# Patient Record
Sex: Male | Born: 1980 | Race: White | Hispanic: No | Marital: Single | Smoking: Current every day smoker
Health system: Southern US, Community
[De-identification: ages and names within clinical notes are randomized; demographics above are authoritative.]

## PROBLEM LIST (undated history)

## (undated) DIAGNOSIS — F32A Depression, unspecified: Secondary | ICD-10-CM

## (undated) DIAGNOSIS — F329 Major depressive disorder, single episode, unspecified: Secondary | ICD-10-CM

## (undated) DIAGNOSIS — F419 Anxiety disorder, unspecified: Secondary | ICD-10-CM

## (undated) HISTORY — PX: ORIF ANKLE FRACTURE: SUR919

## (undated) HISTORY — PX: WISDOM TOOTH EXTRACTION: SHX21

---

## 2003-01-23 ENCOUNTER — Other Ambulatory Visit (HOSPITAL_COMMUNITY): Admission: RE | Admit: 2003-01-23 | Discharge: 2003-01-23 | Payer: Self-pay | Admitting: Psychiatry

## 2011-11-10 ENCOUNTER — Encounter (HOSPITAL_COMMUNITY): Payer: Self-pay | Admitting: Emergency Medicine

## 2011-11-10 ENCOUNTER — Emergency Department (HOSPITAL_COMMUNITY)
Admission: EM | Admit: 2011-11-10 | Discharge: 2011-11-14 | Disposition: A | Discharge status: Home / Self Care | Payer: Self-pay | Attending: Emergency Medicine | Admitting: Emergency Medicine

## 2011-11-10 DIAGNOSIS — T50904A Poisoning by unspecified drugs, medicaments and biological substances, undetermined, initial encounter: Secondary | ICD-10-CM | POA: Insufficient documentation

## 2011-11-10 DIAGNOSIS — F3289 Other specified depressive episodes: Secondary | ICD-10-CM | POA: Insufficient documentation

## 2011-11-10 DIAGNOSIS — F329 Major depressive disorder, single episode, unspecified: Secondary | ICD-10-CM

## 2011-11-10 DIAGNOSIS — R45851 Suicidal ideations: Secondary | ICD-10-CM

## 2011-11-10 DIAGNOSIS — F32A Depression, unspecified: Secondary | ICD-10-CM

## 2011-11-10 DIAGNOSIS — T50901A Poisoning by unspecified drugs, medicaments and biological substances, accidental (unintentional), initial encounter: Secondary | ICD-10-CM | POA: Insufficient documentation

## 2011-11-10 DIAGNOSIS — F17200 Nicotine dependence, unspecified, uncomplicated: Secondary | ICD-10-CM | POA: Insufficient documentation

## 2011-11-10 DIAGNOSIS — F411 Generalized anxiety disorder: Secondary | ICD-10-CM | POA: Insufficient documentation

## 2011-11-10 HISTORY — DX: Major depressive disorder, single episode, unspecified: F32.9

## 2011-11-10 HISTORY — DX: Anxiety disorder, unspecified: F41.9

## 2011-11-10 HISTORY — DX: Depression, unspecified: F32.A

## 2011-11-10 LAB — CBC
MCH: 31.3 pg (ref 26.0–34.0)
Platelets: 185 10*3/uL (ref 150–400)
RBC: 5.01 MIL/uL (ref 4.22–5.81)
RDW: 11.9 % (ref 11.5–15.5)
WBC: 8.3 10*3/uL (ref 4.0–10.5)

## 2011-11-10 LAB — COMPREHENSIVE METABOLIC PANEL
ALT: 22 U/L (ref 0–53)
AST: 20 U/L (ref 0–37)
Albumin: 4.9 g/dL (ref 3.5–5.2)
CO2: 26 mEq/L (ref 19–32)
Calcium: 10.5 mg/dL (ref 8.4–10.5)
GFR calc non Af Amer: >90 mL/min (ref >90)
Sodium: 145 mEq/L (ref 135–145)

## 2011-11-10 MED ORDER — LORAZEPAM 2 MG/ML IJ SOLN
2.0000 mg | Freq: Once | INTRAMUSCULAR | Status: AC
  Administered 2011-11-10: 2 mg via INTRAMUSCULAR
  Filled 2011-11-10: qty 1

## 2011-11-10 MED ORDER — LORAZEPAM 2 MG/ML IJ SOLN
2.0000 mg | INTRAMUSCULAR | Status: DC | PRN
  Administered 2011-11-10 – 2011-11-11 (×2): 2 mg via INTRAMUSCULAR
  Filled 2011-11-10 (×2): qty 1

## 2011-11-10 NOTE — ED Notes (Signed)
Patient is currently speaking with pharmacy tech with no complications

## 2011-11-10 NOTE — ED Notes (Signed)
Family at bedside. 

## 2011-11-10 NOTE — ED Notes (Addendum)
Pt here with family sts pt took approx 14 of dads BP meds last night; pt denies intent to harm self but is avoiding eye contact and not easy to answer questions; pt sts unknown reason for taking meds; pt yawning at present with dilated pupils; pt attempting to leave out of door; per family pt defecating on floor and acting strange at home

## 2011-11-10 NOTE — ED Notes (Signed)
Please see pt's belonging log in binder for full detail of pt's clothes

## 2011-11-10 NOTE — ED Notes (Signed)
Visitor in room with pt; visitor was wanded and escorted to room by security; sitter at bedside

## 2011-11-10 NOTE — ED Notes (Signed)
Sitter returned.  

## 2011-11-10 NOTE — ED Notes (Signed)
Pt admits to taking his fathers bp medications, by shaking his head.  He will not verbally answer any questions for me at this time.

## 2011-11-10 NOTE — ED Provider Notes (Signed)
Medical screening examination/treatment/procedure(s) were conducted as a shared visit with non-physician practitioner(s) and myself.  I personally evaluated the patient during the encounter  Pt with ? Overdose but unknown which meds. Possibly Toprol per father but cannot find meds. Pt uncooperative with history, wandering the ED. ?new psychosis vs substance abuse. Normal vitals.   Charles B. Bernette Mayers, MD 11/10/11 1718

## 2011-11-10 NOTE — ED Notes (Signed)
Phlebotomy has entered room,pt was going to leave the room.  He was asked nicely to stay in room.

## 2011-11-10 NOTE — ED Notes (Signed)
Sitter has returned 

## 2011-11-10 NOTE — ED Notes (Signed)
Relieving sitter for bathroom break

## 2011-11-10 NOTE — ED Notes (Signed)
Sitter to the restroom.
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
## 2011-11-10 NOTE — ED Notes (Signed)
Joshua Mcdowell [phone], pt's sister

## 2011-11-10 NOTE — ED Notes (Signed)
MD at bedside. 
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
## 2011-11-11 LAB — RAPID URINE DRUG SCREEN, HOSP PERFORMED
Amphetamines: NOT DETECTED
Benzodiazepines: NOT DETECTED
Cocaine: NOT DETECTED
Opiates: NOT DETECTED
Tetrahydrocannabinol: NOT DETECTED

## 2011-11-11 MED ORDER — ZIPRASIDONE MESYLATE 20 MG IM SOLR
10.0000 mg | Freq: Three times a day (TID) | INTRAMUSCULAR | Status: DC | PRN
  Administered 2011-11-11 – 2011-11-14 (×5): 10 mg via INTRAMUSCULAR
  Filled 2011-11-11 (×7): qty 20

## 2011-11-11 MED ORDER — ZIPRASIDONE HCL 20 MG PO CAPS
20.0000 mg | ORAL_CAPSULE | Freq: Three times a day (TID) | ORAL | Status: DC | PRN
  Administered 2011-11-13: 20 mg via ORAL
  Filled 2011-11-11: qty 1

## 2011-11-11 MED ORDER — LORAZEPAM 1 MG PO TABS
2.0000 mg | ORAL_TABLET | ORAL | Status: DC | PRN
  Administered 2011-11-11 – 2011-11-14 (×11): 2 mg via ORAL
  Filled 2011-11-11 (×3): qty 2
  Filled 2011-11-11 (×3): qty 1
  Filled 2011-11-11 (×2): qty 2
  Filled 2011-11-11: qty 1
  Filled 2011-11-11 (×5): qty 2

## 2011-11-11 NOTE — ED Notes (Signed)
Patient talking with sitter and states that his mother committed suicide a few years ago and he is getting insurance money each month and is worried about when the money runs out.

## 2011-11-11 NOTE — BH Assessment (Signed)
[facility] Assessment Progress Note      Telepsych completed and recommend inpatient psychiatric care with PRN Geodon for agitation. Pt's father also visited and reports a history of this behavior and numerous past detox's for "narcotics, just like his mother used to do."  Pt is more oriented than yesterday and is able to answer some questions. Pt does not remember the events leading up to this hospitalization. Referrals made to [place], [facility], and [facility] and awaiting disposition.

## 2011-11-11 NOTE — ED Notes (Signed)
Per Pharmacy Geodon 10mg  IM = Geodon 10mg  PO.

## 2011-11-11 NOTE — ED Notes (Signed)
Act team at bedside for pt evaluation. 

## 2011-11-11 NOTE — ED Notes (Signed)
Patient talking with telepsych.
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
## 2011-11-11 NOTE — ED Notes (Signed)
Changed pt's sheets on bed to a fitted sheet.

## 2011-11-11 NOTE — ED Notes (Addendum)
Patients father is visiting with patient at this time.

## 2011-11-12 MED ORDER — NICOTINE 21 MG/24HR TD PT24
21.0000 mg | MEDICATED_PATCH | Freq: Once | TRANSDERMAL | Status: AC
  Administered 2011-11-12: 21 mg via TRANSDERMAL
  Filled 2011-11-12 (×2): qty 1

## 2011-11-12 NOTE — BH Assessment (Signed)
Assessment Note Patient is a 31 year old male that continues to display bizarre behaviors in which he is not orientated to time and situation.  Patient is IVC and pending placement at [facility]. His reactions to questions is delayed and blunted. He looks behind clinician as if seeing something. Patient does deny SI or HI. Patient continues to make incoherent statements and displays disorganized thought patterns. He also appears to be responding to internal stimuli even though he denies A/V hallucinations. Patient reports that he found his mother dead in Mar 22, 2010. Patient reports feelings of depression associated with finding her dead.  Patient reports a history of mental illness and previous psychiatric hospitalizations.    Latoya Diskin is an 31 y.o. male.   Axis I: Psychotic Disorder NOS Axis II: Deferred Axis III:  Past Medical History  Diagnosis Date  . Depression   . Anxiety    Axis IV: economic problems, occupational problems, other psychosocial or environmental problems, problems related to legal system/crime, problems related to social environment and problems with primary support group Axis V: 11-20 some danger of hurting self or others possible OR occasionally fails to maintain minimal personal hygiene OR gross impairment in communication  Past Medical History:  Past Medical History  Diagnosis Date  . Depression   . Anxiety     History reviewed. No pertinent past surgical history.  Family History: History reviewed. No pertinent family history.  Social History:  reports that he has been smoking.  He does not have any smokeless tobacco history on file. He reports that he uses illicit drugs (Cocaine). He reports that he does not drink alcohol.  Additional Social History:  Alcohol / Drug Use Pain Medications: Past use of opiates.  Unsure of what patient is taking.  Has not urinated for UDS as  Prescriptions: According to father patient has a lot of medications but he is not  taking them consistently. Over the Counter: Unknown History of alcohol / drug use?: Yes (Pt uncooperative w/ questions.  Has not yet UDS) Longest period of sobriety (when/how long): Unknown  CIWA: CIWA-Ar BP: 128/76 mmHg Pulse Rate: 56  COWS:    Allergies: No Known Allergies  Home Medications:  (Not in a hospital admission)  OB/GYN Status:  No LMP for male patient.  General Assessment Data Location of Assessment: [facility] ED ACT Assessment: Yes Living Arrangements: Parent Can pt return to current living arrangement?: Yes Admission Status: Involuntary Is patient capable of signing voluntary admission?: No Transfer from: Acute Hospital Referral Source: Self/Family/Friend  Education Status Is patient currently in school?: No  Risk to self Suicidal Ideation: No Suicidal Intent: No Is patient at risk for suicide?: No Suicidal Plan?: No Access to Means: No What has been your use of drugs/alcohol within the last 12 months?: None reported Previous Attempts/Gestures: No How many times?: 0  Other Self Harm Risks: none reportred Triggers for Past Attempts: None known Intentional Self Injurious Behavior: None Family Suicide History: Yes Recent stressful life event(s): Financial Problems Persecutory voices/beliefs?: Yes Depression: Yes Depression Symptoms: Loss of interest in usual pleasures Substance abuse history and/or treatment for substance abuse?: No Suicide prevention information given to non-admitted patients: Not applicable  Risk to Others Homicidal Ideation: No-Not Currently/Within Last 6 Months Thoughts of Harm to Others: No Current Homicidal Intent: No Current Homicidal Plan: No Access to Homicidal Means: No Identified Victim: None reproted History of harm to others?: No Assessment of Violence: None Noted Violent Behavior Description: None Does patient have access to weapons?:  No Criminal Charges Pending?: Yes Describe Pending Criminal Charges: DUI Ticket Does  patient have a court date: Yes Court Date: 11/11/11  Psychosis Hallucinations: Auditory Delusions: None noted  Mental Status Report Appear/Hygiene: Disheveled Eye Contact: Poor Motor Activity: Gestures Speech: Soft;Slow Level of Consciousness: Quiet/awake Mood: Depressed Affect: Blunted Anxiety Level: Moderate Thought Processes: Flight of Ideas Judgement: Impaired Orientation: Not oriented Obsessive Compulsive Thoughts/Behaviors: None  Cognitive Functioning Concentration: Decreased Memory: Recent Impaired;Remote Impaired IQ: Average Insight: Poor Impulse Control: Poor Appetite: Poor Weight Loss: 10  Weight Gain: 0  Sleep: No Change Total Hours of Sleep:  (Unknown) Vegetative Symptoms: Staying in bed  ADLScreening [facility] Assessment Services) Patient's cognitive ability adequate to safely complete daily activities?: Yes Patient able to express need for assistance with ADLs?: Yes Independently performs ADLs?: Yes (appropriate for developmental age)  Abuse/Neglect [facility]) Physical Abuse: Denies;Denies, provider concerned (Comment) Verbal Abuse: Denies Sexual Abuse: Denies  Prior Inpatient Therapy Prior Inpatient Therapy: Yes Prior Therapy Dates: unable to remember Prior Therapy Facilty/Provider(s): unable to rememebr Reason for Treatment: Depression   Prior Outpatient Therapy Prior Outpatient Therapy: Yes Prior Therapy Dates: unable  to remember Prior Therapy Facilty/Provider(s): unable to remember the name of facility Reason for Treatment: Depression   ADL Screening (condition at time of admission) Patient's cognitive ability adequate to safely complete daily activities?: Yes Patient able to express need for assistance with ADLs?: Yes Independently performs ADLs?: Yes (appropriate for developmental age) Weakness of Legs: None Weakness of Arms/Hands: None  Home Assistive Devices/Equipment Home Assistive Devices/Equipment: None    Abuse/Neglect Assessment  (Assessment to be complete while patient is alone) Physical Abuse: Denies;Denies, provider concerned (Comment) Verbal Abuse: Denies Sexual Abuse: Denies Exploitation of patient/patient's resources: Denies Self-Neglect: Denies Values / Beliefs Cultural Requests During Hospitalization: None Spiritual Requests During Hospitalization: None   Advance Directives (For Healthcare) Advance Directive: Patient does not have advance directive;Patient would not like information Nutrition Screen- [facility] Adult/[facility]/AP Patient's home diet: Regular  Additional Information 1:1 In Past 12 Months?: No CIRT Risk: No Elopement Risk: No Does patient have medical clearance?: No     Disposition: Pendingf [facility].  Disposition Disposition of Patient: Inpatient treatment program Type of inpatient treatment program: Adult Patient referred to: Other (Comment)  On Site Evaluation by:   Reviewed with Physician:     Phillip Heal LaVerne 11/12/2011 11:16 PM

## 2011-11-12 NOTE — BH Assessment (Signed)
[facility] Assessment Progress Note      Initiated [facility] referral with Merlyn Albert.  Authorization obtained [id number]  All paperwork faxed to [facility] for review.

## 2011-11-12 NOTE — ED Notes (Signed)
Patient sister at bedside

## 2011-11-12 NOTE — ED Notes (Signed)
Patient calm resting comfortably on stretcher watching TV. Sitter at bedside.

## 2011-11-12 NOTE — ED Notes (Signed)
Pt requested additional medication for anxiety.  Sts "I feel like I'm going to jump up and go screaming down the hallway."

## 2011-11-12 NOTE — ED Notes (Signed)
Patient is taking a shower, with sitter right outside of door.

## 2011-11-12 NOTE — ED Notes (Signed)
Patient having random unorganized thoughts increase volume of voice intermittently along with sitting standing laying down. Patient requested additional medication to help him  feel more calm.

## 2011-11-12 NOTE — ED Notes (Signed)
police arrived to serve IVC papers.  

## 2011-11-13 MED ORDER — NICOTINE 21 MG/24HR TD PT24
21.0000 mg | MEDICATED_PATCH | Freq: Once | TRANSDERMAL | Status: AC
  Administered 2011-11-13: 21 mg via TRANSDERMAL
  Filled 2011-11-13: qty 1

## 2011-11-13 NOTE — ED Provider Notes (Signed)
[facility]. He complains of anxiety. He is communicative. He has had normal blood pressures after the overdose of the blood pressure medications. He has been receiving when necessary Ativan, and Geodon. His chronic medications have not been initiated, yet.   Joshua Melter, MD 11/13/11 [phone]

## 2011-11-13 NOTE — ED Notes (Signed)
Pt resting, talking with sitter

## 2011-11-13 NOTE — ED Notes (Signed)
Pt requested sandwich and drink/ provided food to patient.
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
## 2011-11-14 NOTE — ED Notes (Signed)
Previous Nicotine patch removed.
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
## 2011-11-14 NOTE — ED Notes (Signed)
Patient complaint of feeling slightly anxious, requested Ativan medication given PRN.

## 2011-11-14 NOTE — ED Notes (Signed)
Patient continues to remain agitated he has been out to the desk a couple of times trying to contact his lawyer, and family member. He is requesting to go home. He ask me to contact the person with [employer] and he is requesting to speak with him. Contacted the [employer] provide and he will come into re-evaluate the patient in about 1 hour patient advised and he is happy about this.

## 2011-11-14 NOTE — ED Notes (Signed)
Complaints of feeling anxious and restless. Requested Geodon injection.

## 2011-11-14 NOTE — BH Assessment (Signed)
[facility] Assessment Progress Note      Confirmed pt is on [facility] wait list per Aurther Loft 05:25

## 2011-11-14 NOTE — ED Notes (Signed)
Patient is being discharged with his father with instructions and he is to follow up with outpatient [facility].

## 2011-11-14 NOTE — ED Notes (Signed)
PAtient up to shower this morning Alert and oriented,NAD at the present time.

## 2011-11-14 NOTE — ED Notes (Signed)
Patients father called and demanded that patient should not be released. He states " [place] girlfriend called me and said he was going to be discharged, He is not well and should not be discharged." I advised him that I could have the [employer] provider to contact him. Mr. Pettengill gave me the following number and I gave the number to [facility] to contact Mr. Ridley.

## 2012-01-16 ENCOUNTER — Other Ambulatory Visit: Payer: Self-pay

## 2012-01-16 ENCOUNTER — Emergency Department (HOSPITAL_COMMUNITY): Payer: Self-pay

## 2012-01-16 ENCOUNTER — Emergency Department (HOSPITAL_COMMUNITY)
Admission: EM | Admit: 2012-01-16 | Discharge: 2012-01-18 | Disposition: A | Discharge status: Home / Self Care | Payer: Self-pay | Attending: Emergency Medicine | Admitting: Emergency Medicine

## 2012-01-16 ENCOUNTER — Encounter (HOSPITAL_COMMUNITY): Payer: Self-pay | Admitting: *Deleted

## 2012-01-16 DIAGNOSIS — F17200 Nicotine dependence, unspecified, uncomplicated: Secondary | ICD-10-CM | POA: Insufficient documentation

## 2012-01-16 DIAGNOSIS — F3289 Other specified depressive episodes: Secondary | ICD-10-CM | POA: Insufficient documentation

## 2012-01-16 DIAGNOSIS — F411 Generalized anxiety disorder: Secondary | ICD-10-CM | POA: Insufficient documentation

## 2012-01-16 DIAGNOSIS — R569 Unspecified convulsions: Secondary | ICD-10-CM | POA: Insufficient documentation

## 2012-01-16 DIAGNOSIS — F1910 Other psychoactive substance abuse, uncomplicated: Secondary | ICD-10-CM

## 2012-01-16 DIAGNOSIS — F329 Major depressive disorder, single episode, unspecified: Secondary | ICD-10-CM | POA: Insufficient documentation

## 2012-01-16 DIAGNOSIS — R4182 Altered mental status, unspecified: Secondary | ICD-10-CM | POA: Insufficient documentation

## 2012-01-16 LAB — RAPID URINE DRUG SCREEN, HOSP PERFORMED
Amphetamines: NOT DETECTED
Barbiturates: NOT DETECTED
Benzodiazepines: POSITIVE — AB
Cocaine: NOT DETECTED
Opiates: POSITIVE — AB
Tetrahydrocannabinol: POSITIVE — AB

## 2012-01-16 LAB — CBC
HCT: 38.8 % — ABNORMAL LOW (ref 39.0–52.0)
Hemoglobin: 14.5 g/dL (ref 13.0–17.0)
MCH: 31.7 pg (ref 26.0–34.0)
MCHC: 37.4 g/dL — ABNORMAL HIGH (ref 30.0–36.0)
MCV: 84.7 fL (ref 78.0–100.0)
Platelets: 285 10*3/uL (ref 150–400)
RBC: 4.58 MIL/uL (ref 4.22–5.81)
RDW: 12 % (ref 11.5–15.5)
WBC: 7.3 10*3/uL (ref 4.0–10.5)

## 2012-01-16 LAB — COMPREHENSIVE METABOLIC PANEL
ALT: 14 U/L (ref 0–53)
AST: 14 U/L (ref 0–37)
Albumin: 4.7 g/dL (ref 3.5–5.2)
Alkaline Phosphatase: 74 U/L (ref 39–117)
BUN: 8 mg/dL (ref 6–23)
CO2: 25 mEq/L (ref 19–32)
Calcium: 9.9 mg/dL (ref 8.4–10.5)
Chloride: 100 mEq/L (ref 96–112)
Creatinine, Ser: 1.11 mg/dL (ref 0.50–1.35)
GFR calc Af Amer: >90 mL/min (ref >90)
GFR calc non Af Amer: 88 mL/min — ABNORMAL LOW (ref >90)
Glucose, Bld: 115 mg/dL — ABNORMAL HIGH (ref 70–99)
Potassium: 4 mEq/L (ref 3.5–5.1)
Sodium: 136 mEq/L (ref 135–145)
Total Bilirubin: 0.4 mg/dL (ref 0.3–1.2)
Total Protein: 7.9 g/dL (ref 6.0–8.3)

## 2012-01-16 LAB — ETHANOL: Alcohol, Ethyl (B): <11 mg/dL (ref 0–11)

## 2012-01-16 LAB — ACETAMINOPHEN LEVEL: Acetaminophen (Tylenol), Serum: <15 ug/mL (ref 10–30)

## 2012-01-16 LAB — GLUCOSE, CAPILLARY: Glucose-Capillary: 114 mg/dL — ABNORMAL HIGH (ref 70–99)

## 2012-01-16 LAB — SALICYLATE LEVEL: Salicylate Lvl: <2 mg/dL — ABNORMAL LOW (ref 2.8–20.0)

## 2012-01-16 MED ORDER — ONDANSETRON HCL 4 MG PO TABS: 4.0000 mg | ORAL_TABLET | Freq: Three times a day (TID) | ORAL | Status: DC | PRN

## 2012-01-16 MED ORDER — ACETAMINOPHEN 325 MG PO TABS
650.0000 mg | ORAL_TABLET | ORAL | Status: DC | PRN
  Filled 2012-01-16: qty 2

## 2012-01-16 MED ORDER — IBUPROFEN 600 MG PO TABS
600.0000 mg | ORAL_TABLET | Freq: Three times a day (TID) | ORAL | Status: DC | PRN
  Administered 2012-01-16 – 2012-01-17 (×2): 600 mg via ORAL
  Filled 2012-01-16 (×3): qty 1

## 2012-01-16 MED ORDER — SODIUM CHLORIDE 0.9 % IV BOLUS (SEPSIS)
1000.0000 mL | Freq: Once | INTRAVENOUS | Status: AC
  Administered 2012-01-16: 1000 mL via INTRAVENOUS

## 2012-01-16 MED ORDER — LORAZEPAM 1 MG PO TABS
1.0000 mg | ORAL_TABLET | Freq: Three times a day (TID) | ORAL | Status: DC | PRN
  Administered 2012-01-16 – 2012-01-17 (×2): 1 mg via ORAL
  Filled 2012-01-16 (×3): qty 1

## 2012-01-16 NOTE — ED Notes (Signed)
telepsych request faxed and called 
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
## 2012-01-16 NOTE — ED Notes (Signed)
Sister at bedside. Pt gave verbal agreement for rn to be able to talk about medical hx in front of sister.  Sister reports pt found his mother deceased from OD 2 years ago in Dec. Pt was hospitalized 2 months ago for same. Pt was in catatonic state, but father had taken care of pt for a few days before bringing to hospital, so pt tested negative on drug screen. Sister reported pt said he had taken "synthetic morphine" someone was making in their home.

## 2012-01-16 NOTE — ED Notes (Signed)
Pt back from CT

## 2012-01-16 NOTE — ED Notes (Signed)
Pt jittery, c/o pain in lower back and anxiety.  Ibuprofen and ativan given for pain and anxiety.  Pt denies SI and HI.

## 2012-01-16 NOTE — ED Notes (Signed)
Report given to anita, rn

## 2012-01-16 NOTE — ED Provider Notes (Signed)
History    31yM with AMS. Pt apparently with seizure like activity witnessed by girlfriend. Unresponsive and rigid for approximately 2 minutes. Pt apparently left for approximately 15 minutes prior to this. Pt apparently with hx of drug abuse but none witnessed today. Pt denies, but hx from hx unreliable. No report of trauma. No fever. No witnessed ingestion. Pt does say he has only been sleeping for a couple hours every night. Pt with hx of depression and anxiety. Says prescribed celexa and trazadone but has been out of trazadone for awhile. Buspar on med list but pt denies taking. EMS reports that father remarked that pt with similar bizarre behavior after previous "synthetic" drug use.    CSN: [id number]  Arrival date & time 01/16/12  1543   First MD Initiated Contact with Patient 01/16/12 1554      Chief Complaint  Patient presents with  . Altered Mental Status  . possible drug abuse     (Consider location/radiation/quality/duration/timing/severity/associated sxs/prior treatment) HPI  Past Medical History  Diagnosis Date  . Depression   . Anxiety     No past surgical history on file.  No family history on file.  History  Substance Use Topics  . Smoking status: Current Every Day Smoker  . Smokeless tobacco: Not on file  . Alcohol Use: No      Review of Systems   Review of symptoms negative unless otherwise noted in HPI.   Allergies  Review of patient's allergies indicates no known allergies.  Home Medications   Current Outpatient Rx  Name Route Sig Dispense Refill  . BUSPIRONE HCL 10 MG PO TABS Oral Take 10 mg by mouth 3 (three) times daily.    Marland Kitchen CITALOPRAM HYDROBROMIDE 20 MG PO TABS Oral Take 20 mg by mouth daily.    . TRAZODONE HCL 100 MG PO TABS Oral Take 100 mg by mouth at bedtime.      BP 126/73  Resp 17  SpO2 97%  Physical Exam  Nursing note and vitals reviewed. Constitutional: He appears well-developed and well-nourished. No distress.  HENT:    Head: Normocephalic and atraumatic.       No external signs of head trauma  Eyes: Conjunctivae normal are normal. Right eye exhibits no discharge. Left eye exhibits no discharge.  Neck: Neck supple.       No nuchal rigidity  Cardiovascular: Normal rate, regular rhythm and normal heart sounds.  Exam reveals no gallop and no friction rub.   No murmur heard. Pulmonary/Chest: Effort normal and breath sounds normal. No respiratory distress.  Abdominal: Soft. He exhibits no distension. There is no tenderness.  Musculoskeletal: He exhibits no edema and no tenderness.       Small area of ecchymosis to medial aspect R elbow without significant underlying bony tenderness  Neurological: He is alert. No cranial nerve deficit. He exhibits normal muscle tone. Coordination normal.       Disoriented to time and place.   Skin: Skin is warm and dry.  Psychiatric: His behavior is normal. Thought content normal.       Mild agitation. Poor attention span. Rambling speech with flight of ideas.     ED Course  Procedures (including critical care time)  CRITICAL CARE Performed by: Raeford Razor   Total critical care time: 35 minutes  Critical care time was exclusive of separately billable procedures and treating other patients.  Critical care was necessary to treat or prevent imminent or life-threatening deterioration.  Critical care was time spent  personally by me on the following activities: development of treatment plan with patient and/or surrogate as well as nursing, discussions with consultants, evaluation of patient's response to treatment, examination of patient, obtaining history from patient or surrogate, ordering and performing treatments and interventions, ordering and review of laboratory studies, ordering and review of radiographic studies, pulse oximetry and re-evaluation of patient's condition.   Labs Reviewed  CBC - Abnormal; Notable for the following:    HCT 38.8 (*)     MCHC 37.4  (*)     All other components within normal limits  COMPREHENSIVE METABOLIC PANEL - Abnormal; Notable for the following:    Glucose, Bld 115 (*)     GFR calc non Af Amer 88 (*)     All other components within normal limits  SALICYLATE LEVEL - Abnormal; Notable for the following:    Salicylate Lvl <2.0 (*)     All other components within normal limits  URINE RAPID DRUG SCREEN (HOSP PERFORMED) - Abnormal; Notable for the following:    Opiates POSITIVE (*)     Benzodiazepines POSITIVE (*)     Tetrahydrocannabinol POSITIVE (*)     All other components within normal limits  GLUCOSE, CAPILLARY - Abnormal; Notable for the following:    Glucose-Capillary 114 (*)     All other components within normal limits  ACETAMINOPHEN LEVEL  ETHANOL   Ct Head Wo Contrast  01/16/2012  *RADIOLOGY REPORT*  Clinical Data: Altered mental status  CT HEAD WITHOUT CONTRAST  Technique:  Contiguous axial images were obtained from the base of the skull through the vertex without contrast.  Comparison: None  Findings: The brain has a normal appearance without evidence for hemorrhage, infarction, hydrocephalus, or mass lesion.  There is no extra axial fluid collection.  The skull and paranasal sinuses are normal.  IMPRESSION:  1.  No acute intracranial abnormalities.   Original Report Authenticated By: Signa Kell, M.D.     EKG:  Rhythm: normal sinus Rate: 97 Axis: normal Intervals: normal ST segments: NS ST changes inferiorly   1. New onset seizure   2. Drug abuse   3. Altered mental state       MDM  31yM with possible seizure and bizarre behavior. Suspect secondary to drug abuse. Will medically screen including CT for new onset seizure activity. Pt currently not psychiatrically stable. If continues with same behavior will pursuit psych care once medically cleared.  6:03 PM Medical w/u fairly unremarkable aside from UDS. CT neg. Pt continues with bizarre behavior. Suspicion remains that related to  substance abuse today. Will obtain psychiatry consultation.       Raeford Razor, MD 01/16/12 [phone]

## 2012-01-16 NOTE — ED Notes (Signed)
Pt asked about taking something for heroine withdrawal as he admitted to doing it 2-3 times over the last 2 weeks

## 2012-01-17 ENCOUNTER — Encounter (HOSPITAL_COMMUNITY): Payer: Self-pay | Admitting: Emergency Medicine

## 2012-01-17 MED ORDER — ZOLPIDEM TARTRATE 5 MG PO TABS
5.0000 mg | ORAL_TABLET | Freq: Every evening | ORAL | Status: DC | PRN
  Administered 2012-01-17: 5 mg via ORAL
  Filled 2012-01-17: qty 1

## 2012-01-17 MED ORDER — DIVALPROEX SODIUM 500 MG PO DR TAB
500.0000 mg | DELAYED_RELEASE_TABLET | Freq: Two times a day (BID) | ORAL | Status: DC
  Administered 2012-01-17: 500 mg via ORAL
  Filled 2012-01-17 (×2): qty 1

## 2012-01-17 MED ORDER — LORAZEPAM 2 MG/ML IJ SOLN
2.0000 mg | Freq: Once | INTRAMUSCULAR | Status: AC
  Administered 2012-01-17: 2 mg via INTRAMUSCULAR
  Filled 2012-01-17: qty 1

## 2012-01-17 MED ORDER — DIPHENHYDRAMINE HCL 25 MG PO CAPS
50.0000 mg | ORAL_CAPSULE | Freq: Every day | ORAL | Status: DC
  Administered 2012-01-17: 50 mg via ORAL
  Filled 2012-01-17: qty 2

## 2012-01-17 MED ORDER — NICOTINE 21 MG/24HR TD PT24
21.0000 mg | MEDICATED_PATCH | Freq: Every morning | TRANSDERMAL | Status: DC
  Administered 2012-01-17: 21 mg via TRANSDERMAL
  Filled 2012-01-17: qty 1

## 2012-01-17 MED ORDER — ZIPRASIDONE MESYLATE 20 MG IM SOLR
20.0000 mg | Freq: Once | INTRAMUSCULAR | Status: AC
  Administered 2012-01-17: 20 mg via INTRAMUSCULAR
  Filled 2012-01-17: qty 20

## 2012-01-17 MED ORDER — RISPERIDONE 1 MG PO TABS
1.0000 mg | ORAL_TABLET | Freq: Two times a day (BID) | ORAL | Status: DC
  Administered 2012-01-17: 1 mg via ORAL
  Filled 2012-01-17: qty 1

## 2012-01-17 NOTE — ED Notes (Signed)
Pt reports that his tongue is feeling swollen and jaw feels that it it clenching, no resp distress noted, pt talking w/o difficulty.  Dr Rubin Payor aware and will see.

## 2012-01-17 NOTE — ED Notes (Signed)
Pt talking quietly w/ girlfriend, instructed to notify staff if there are any changes

## 2012-01-17 NOTE — BH Assessment (Signed)
Assessment Note   Joshua Mcdowell is an 31 y.o. male to presents voluntarily to [facility] after girlfriend called EMS. Per ED notes pt exhibited seizure like activity and became confused so g/f called EMS. Pt reports mood as "kinda down" and anxious. His affect is euphoric. He is restless and hyperactive. Pt is oriented only to person. He displays looseness of association. At times he responds appropriately to writer's questions. At other times, his answers are nonsensical. He talks of assassins and beans when asked re: his hx of abuse. He asks Clinical research associate if he can "rebuild the shelves" and then asks if the RN will give him geodal to help him sleep and pieces of shine". Pt sts his mother died in August 19, 2009. Per 2011-11-19 assessment, pt found mother's body after suicide by overdose. He endorses VH. Pt sts he snorts and uses heroin intravenously. His UDS is also + for thc and cocaine. Pt denies any suicide attempts. He denies suicidal and homicidal thoughts, intentions or plans. Pt lives w/ sister and father. Pt sts he rarely sleeps and he endorses fatigue.   Axis I: Psychotic Disorder NOS Axis II: Deferred Axis III:  Past Medical History  Diagnosis Date  . Depression   . Anxiety    Axis IV: other psychosocial or environmental problems and problems related to social environment Axis V: 21-30 behavior considerably influenced by delusions or hallucinations OR serious impairment in judgment, communication OR inability to function in almost all areas  Past Medical History:  Past Medical History  Diagnosis Date  . Depression   . Anxiety     No past surgical history on file.  Family History: No family history on file.  Social History:  reports that he has been smoking.  He does not have any smokeless tobacco history on file. He reports that he uses illicit drugs (Cocaine and Heroin). He reports that he does not drink alcohol.  Additional Social History:  Alcohol / Drug Use Pain Medications: n/a Prescriptions:  see PTA meds Over the Counter: n/a History of alcohol / drug use?: Yes Longest period of sobriety (when/how long): 1.5 years Substance #1 Name of Substance 1: heroin - IV and snorted 1 - Age of First Use: not given 1 - Amount (size/oz): varies 1 - Frequency: varies 1 - Duration: off and on 1 - Last Use / Amount: pt doesn't say Substance #2 Name of Substance 2: cocaine 2 - Age of First Use: not given 2 - Amount (size/oz): pt sts small amount 2 - Frequency: rarely 2 - Duration: pt sts roommate was cocaine dealer when asked re duration 2 - Last Use / Amount: pt doesn't give last date of use Substance #3 Name of Substance 3: thc 3 - Age of First Use: not given 3 - Amount (size/oz): not given 3 - Frequency: rarely 3 - Duration: off and on 3 - Last Use / Amount: pt doesn't give last date of use  CIWA: CIWA-Ar BP: 131/74 mmHg Pulse Rate: 67  COWS:    Allergies: No Known Allergies  Home Medications:  (Not in a hospital admission)  OB/GYN Status:  No LMP for male patient.  General Assessment Data Location of Assessment: [facility] ED Living Arrangements: Parent;Other relatives Can pt return to current living arrangement?: Yes Admission Status: Voluntary Is patient capable of signing voluntary admission?: No Transfer from: Acute Hospital Referral Source: Self/Family/Friend  Education Status Is patient currently in school?: No Current Grade: na Highest grade of school patient has completed: 55 Name of school:  [employer]  Risk to self Suicidal Ideation: No Suicidal Intent: No Is patient at risk for suicide?: No Suicidal Plan?: No Access to Means: No What has been your use of drugs/alcohol within the last 12 months?: pt sts occasional heroin use, also + for coke & thc Previous Attempts/Gestures: No (pt denies) How many times?: 0  Other Self Harm Risks: pt denies Triggers for Past Attempts:  (n/a) Intentional Self Injurious Behavior: None Family Suicide History:  (mother died  from overdose Jul 29, 2009) Recent stressful life event(s): Other (Comment) (pt denies stressors) Persecutory voices/beliefs?: No Depression: No Depression Symptoms: Insomnia Substance abuse history and/or treatment for substance abuse?: No Suicide prevention information given to non-admitted patients: Not applicable  Risk to Others Homicidal Ideation: No Thoughts of Harm to Others: No Current Homicidal Intent: No Current Homicidal Plan: No Access to Homicidal Means: No Identified Victim: none History of harm to others?: No (pt denies) Assessment of Violence: None Noted Violent Behavior Description: pt denies hx of violence Does patient have access to weapons?: No Criminal Charges Pending?: No Does patient have a court date: No  Psychosis Hallucinations: Visual Delusions: Unspecified  Mental Status Report Appear/Hygiene: Other (Comment) (unremarkable) Eye Contact: Good Motor Activity: Freedom of movement;Hyperactivity;Restlessness Speech: Incoherent;Logical/coherent Level of Consciousness: Alert Mood: Depressed;Anxious Affect: Euphoric Anxiety Level: Moderate Thought Processes: Flight of Ideas;Tangential;Relevant Judgement: Impaired Orientation: Person (oriented to person only) Obsessive Compulsive Thoughts/Behaviors: None  Cognitive Functioning Concentration: Decreased Memory: Remote Impaired;Recent Impaired IQ: Average Insight: Poor Impulse Control: Poor Appetite: Fair Weight Loss: 0  Weight Gain: 0  Sleep: Decreased Total Hours of Sleep:  (unable to give # of hrs) Vegetative Symptoms: None  ADLScreening [facility] Assessment Services) Patient's cognitive ability adequate to safely complete daily activities?: Yes Patient able to express need for assistance with ADLs?: Yes Independently performs ADLs?: Yes (appropriate for developmental age)  Abuse/Neglect [facility]) Physical Abuse: Denies Verbal Abuse: Denies Sexual Abuse: Denies  Prior Inpatient Therapy Prior Inpatient  Therapy: Yes Prior Therapy Dates: pt unsure of dates Prior Therapy Facilty/Provider(s): pt unsure of facility Reason for Treatment: unknown  Prior Outpatient Therapy Prior Outpatient Therapy: No (pt denies) Prior Therapy Dates: na Prior Therapy Facilty/Provider(s): na Reason for Treatment: na  ADL Screening (condition at time of admission) Patient's cognitive ability adequate to safely complete daily activities?: Yes Patient able to express need for assistance with ADLs?: Yes Independently performs ADLs?: Yes (appropriate for developmental age) Weakness of Legs: None Weakness of Arms/Hands: None       Abuse/Neglect Assessment (Assessment to be complete while patient is alone) Physical Abuse: Denies Verbal Abuse: Denies Sexual Abuse: Denies Exploitation of patient/patient's resources: Denies Self-Neglect: Denies     Merchant navy officer (For Healthcare) Advance Directive: Patient would not like information;Patient does not have advance directive    Additional Information 1:1 In Past 12 Months?: No CIRT Risk: No Elopement Risk: Yes Does patient have medical clearance?: Yes     Disposition:  Disposition Disposition of Patient: Inpatient treatment program (telepsych recommends inpatient)  On Site Evaluation by:   Reviewed with Physician:     Donnamarie Rossetti P 01/17/2012 3:29 AM

## 2012-01-17 NOTE — ED Notes (Signed)
Awake sitting up in bed eating a sandwich

## 2012-01-17 NOTE — ED Notes (Signed)
Up to the bathroom 

## 2012-01-17 NOTE — ED Notes (Signed)
Pt in the shower, said he's doing ok through the door.  Heard shower running.

## 2012-01-17 NOTE — BHH Counselor (Signed)
Patient has been accepted at [facility] per Jorje Guild PA pending bed availability. Patient needs to be involuntarily committed.

## 2012-01-17 NOTE — ED Notes (Signed)
ACT into see 

## 2012-01-17 NOTE — ED Notes (Signed)
Dr pickering at bedside  

## 2012-01-17 NOTE — ED Notes (Signed)
Pt reports increased tonuge swelling and drooling, dr pickering aware, TO give benadryl PO now

## 2012-01-17 NOTE — BHH Counselor (Signed)
Dr. Jake Michaelis telepsych consult reverses pt's IVC orders and recommends d/c. EDP and RN notified. Writer discussed w/ pt the dangers of heroin use including substance induced psychosis for which he was admitted to [facility]. Pt sts that his resulting psychosis from heroin use scares him. Writer gave pt resources for [employer] and outpt treatment.

## 2012-01-17 NOTE — ED Notes (Signed)
Received pt resting on stretcher.  AAO x 3, no distress.  Family at bedside.  Pt calm & cooperative at present, no distress noted.

## 2012-01-17 NOTE — ED Provider Notes (Signed)
  Physical Exam  BP 122/77  Pulse 66  Temp 98.6 F (37 C) (Oral)  Resp 18  SpO2 100%  Physical Exam  ED Course  Procedures  MDM Patient's had a repeat telepsych evaluation and they recommended discharge. He recommended no medications. He'll be discharged home.      Juliet Rude. Rubin Payor, MD 01/17/12 [phone]

## 2012-01-17 NOTE — ED Notes (Signed)
Up to the bathroom in the shower

## 2012-01-17 NOTE — ED Notes (Signed)
Has been able to eat supper w/o difficulty, drinking fluids w/o difficulty.  Pt ambulatory, c/o sore ness in logs, nad., reports tongue swelling and jaw locking has improved

## 2012-06-20 ENCOUNTER — Emergency Department (HOSPITAL_BASED_OUTPATIENT_CLINIC_OR_DEPARTMENT_OTHER): Payer: Self-pay

## 2012-06-20 ENCOUNTER — Emergency Department (HOSPITAL_BASED_OUTPATIENT_CLINIC_OR_DEPARTMENT_OTHER)
Admission: EM | Admit: 2012-06-20 | Discharge: 2012-06-20 | Disposition: A | Discharge status: Home / Self Care | Payer: Self-pay | Attending: Emergency Medicine | Admitting: Emergency Medicine

## 2012-06-20 ENCOUNTER — Encounter (HOSPITAL_BASED_OUTPATIENT_CLINIC_OR_DEPARTMENT_OTHER): Payer: Self-pay | Admitting: Emergency Medicine

## 2012-06-20 DIAGNOSIS — F17200 Nicotine dependence, unspecified, uncomplicated: Secondary | ICD-10-CM | POA: Insufficient documentation

## 2012-06-20 DIAGNOSIS — Z79899 Other long term (current) drug therapy: Secondary | ICD-10-CM | POA: Insufficient documentation

## 2012-06-20 DIAGNOSIS — Z9889 Other specified postprocedural states: Secondary | ICD-10-CM | POA: Insufficient documentation

## 2012-06-20 DIAGNOSIS — S93401A Sprain of unspecified ligament of right ankle, initial encounter: Secondary | ICD-10-CM

## 2012-06-20 DIAGNOSIS — X500XXA Overexertion from strenuous movement or load, initial encounter: Secondary | ICD-10-CM | POA: Insufficient documentation

## 2012-06-20 DIAGNOSIS — Y9389 Activity, other specified: Secondary | ICD-10-CM | POA: Insufficient documentation

## 2012-06-20 DIAGNOSIS — F411 Generalized anxiety disorder: Secondary | ICD-10-CM | POA: Insufficient documentation

## 2012-06-20 DIAGNOSIS — F1910 Other psychoactive substance abuse, uncomplicated: Secondary | ICD-10-CM | POA: Insufficient documentation

## 2012-06-20 DIAGNOSIS — F329 Major depressive disorder, single episode, unspecified: Secondary | ICD-10-CM | POA: Insufficient documentation

## 2012-06-20 DIAGNOSIS — F3289 Other specified depressive episodes: Secondary | ICD-10-CM | POA: Insufficient documentation

## 2012-06-20 DIAGNOSIS — S93409A Sprain of unspecified ligament of unspecified ankle, initial encounter: Secondary | ICD-10-CM | POA: Insufficient documentation

## 2012-06-20 DIAGNOSIS — Z8781 Personal history of (healed) traumatic fracture: Secondary | ICD-10-CM | POA: Insufficient documentation

## 2012-06-20 DIAGNOSIS — Z765 Malingerer [conscious simulation]: Secondary | ICD-10-CM | POA: Insufficient documentation

## 2012-06-20 DIAGNOSIS — Y929 Unspecified place or not applicable: Secondary | ICD-10-CM | POA: Insufficient documentation

## 2012-06-20 MED ORDER — IBUPROFEN 400 MG PO TABS
400.0000 mg | ORAL_TABLET | Freq: Once | ORAL | Status: DC
  Filled 2012-06-20: qty 1

## 2012-06-20 NOTE — ED Provider Notes (Signed)
History     CSN: [id number]  Arrival date & time 06/20/12  0136   First MD Initiated Contact with Patient 06/20/12 [phone]      Chief Complaint  Patient presents with  . Ankle Pain    right ankle pain after hearing popping sound when lifting girl friend who had seizure    (Consider location/radiation/quality/duration/timing/severity/associated sxs/prior treatment) HPI This 32 year old male states he was picking up his girlfriend who had a fainting spell and he felt pain to his right lateralizing to a prior ankle fracture, his pain is severe, he came to the ED for evaluation shortly afterwards as this occurred within the last hour or so, he is no knee pain no other injury or other concerns, he is no head or neck injury no neck pain back pain chest pain shortness breath abdominal pain and no focal weakness or numbness just right ankle pain only without radiation or associated symptoms. There is no treatment prior to arrival. Patient's girlfriend states the patient drove to the ED the patient denies driving to the ED. Past Medical History  Diagnosis Date  . Depression   . Anxiety    polysubstance abuse with heroin abuse  Past Surgical History  Procedure Laterality Date  . Orif ankle fracture    . Wisdom tooth extraction      History reviewed. No pertinent family history.  History  Substance Use Topics  . Smoking status: Current Every Day Smoker  . Smokeless tobacco: Not on file  . Alcohol Use: No      Review of Systems 10 Systems reviewed and are negative for acute change except as noted in the HPI. Allergies  Review of patient's allergies indicates no known allergies.  Home Medications   Current Outpatient Rx  Name  Route  Sig  Dispense  Refill  . busPIRone (BUSPAR) 10 MG tablet   Oral   Take 10 mg by mouth 3 (three) times daily.         . citalopram (CELEXA) 20 MG tablet   Oral   Take 20 mg by mouth daily.         . traZODone (DESYREL) 100 MG tablet   Oral  Take 100 mg by mouth at bedtime.           BP 131/85  Pulse 72  Temp(Src) 98.1 F (36.7 C) (Oral)  Resp 18  SpO2 100%  Physical Exam  Nursing note and vitals reviewed. Constitutional:  Awake, alert, nontoxic appearance.  HENT:  Head: Atraumatic.  Eyes: Right eye exhibits no discharge. Left eye exhibits no discharge.  Neck: Neck supple.  Cervical spine nontender  Pulmonary/Chest: Effort normal. He exhibits no tenderness.  Abdominal: Soft. There is no tenderness. There is no rebound.  Musculoskeletal: He exhibits tenderness. He exhibits no edema.  Baseline ROM, no obvious new focal weakness. Both arms and left leg nontender. Right leg is nontender the thigh knee proximal fibula calf Achilles tendon calcaneus midfoot forefoot with dorsalis pedis pulse intact capillary refill less than 2 seconds normal light to the foot good movement to the foot with right lateral mild diffuse ankle tenderness without swelling or deformity noted, right medial ankle nontender  Neurological: He is alert.  Mental status and motor strength appears baseline for patient and situation.  Skin: No rash noted.  Psychiatric: He has a normal mood and affect.    ED Course  Procedures (including critical care time) Pt stated has crutches at home, Pt misled ED staff by stating he  did not drive and had a ride when his girlfriend with whom he arrived told ED staff he is their driver, Pt initially agreed to ibuprofen for pain, then wanted medicine to make him sleepy, when informed he could have Benadryl only if not driving, and ED staff not comfortable administering controlled substances given his reported injury and history of drug abuse, Pt witnessed by ED RN to tear up his discharge instructions, refuse ibuprofen, take off his own splint, and walk out of ED without any limp or apparent pain. Labs Reviewed - No data to display No results found.   1. Right ankle sprain, initial encounter   2. Drug-seeking behavior    3. Polysubstance abuse       MDM  The patient appears reasonably screened and/or stabilized for discharge and I doubt any other medical condition or other [facility] requiring further screening, evaluation, or treatment in the ED at this time prior to discharge.        Hurman Horn, MD 06/27/12 [phone]

## 2012-06-20 NOTE — ED Notes (Addendum)
Mr. Joshua Mcdowell was offered motrin 400 mg po  And refused  And requested to speak with the ordering EDP. Dr Joshua Mcdowell accompanied by myself came to patient room and it was explained the effectiveness of anti-inflammatory medications for this type of injury and how the decrease in pain would allow him to sleep. Benadryl was offered along with Motrin as a sleep aide and pt continues to refuse both medications and requested a narcotic to help him sleep. The patient was reminded of his hx of addiction to narcotics and reaffirmed that the motrin would decrease his pain and the benadryl would help him sleep. At discharge Mr Joshua Mcdowell removed splint previously placed by the ED tech and threw it to the floor and tore up the discharge instructions that was given by this writer and threw them in the trash after signing that he received them. Mr Joshua Mcdowell then proceeded to ambulate w/o assist with a steady gait and no limb or difficulty ambulating was noted to the discharge area and waiting room presumed to be going home. Discharge vital signs were refused.

## 2012-06-20 NOTE — ED Notes (Signed)
Pt reports that he experience Right ankle pain after attempting to lift seizing girlfriend. States he heard a popping sound at that time and now can not weight bear w/o extreme pain. States Hx of fracture same ankle with pins place approx 2006 at [facility]

## 2012-06-20 NOTE — ED Notes (Signed)
Return from xray

## 2015-04-07 ENCOUNTER — Emergency Department (HOSPITAL_BASED_OUTPATIENT_CLINIC_OR_DEPARTMENT_OTHER)
Admission: EM | Admit: 2015-04-07 | Discharge: 2015-04-07 | Disposition: A | Discharge status: Home / Self Care | Payer: Self-pay | Attending: Emergency Medicine | Admitting: Emergency Medicine

## 2015-04-07 ENCOUNTER — Encounter (HOSPITAL_BASED_OUTPATIENT_CLINIC_OR_DEPARTMENT_OTHER): Payer: Self-pay

## 2015-04-07 DIAGNOSIS — Y998 Other external cause status: Secondary | ICD-10-CM | POA: Insufficient documentation

## 2015-04-07 DIAGNOSIS — Y9289 Other specified places as the place of occurrence of the external cause: Secondary | ICD-10-CM | POA: Insufficient documentation

## 2015-04-07 DIAGNOSIS — T401X1A Poisoning by heroin, accidental (unintentional), initial encounter: Secondary | ICD-10-CM | POA: Insufficient documentation

## 2015-04-07 DIAGNOSIS — Y9389 Activity, other specified: Secondary | ICD-10-CM | POA: Insufficient documentation

## 2015-04-07 DIAGNOSIS — F1410 Cocaine abuse, uncomplicated: Secondary | ICD-10-CM | POA: Insufficient documentation

## 2015-04-07 DIAGNOSIS — F17200 Nicotine dependence, unspecified, uncomplicated: Secondary | ICD-10-CM | POA: Insufficient documentation

## 2015-04-07 DIAGNOSIS — F1310 Sedative, hypnotic or anxiolytic abuse, uncomplicated: Secondary | ICD-10-CM | POA: Insufficient documentation

## 2015-04-07 DIAGNOSIS — Z8659 Personal history of other mental and behavioral disorders: Secondary | ICD-10-CM | POA: Insufficient documentation

## 2015-04-07 DIAGNOSIS — F1110 Opioid abuse, uncomplicated: Secondary | ICD-10-CM | POA: Insufficient documentation

## 2015-04-07 LAB — CBC WITH DIFFERENTIAL/PLATELET
BASOS ABS: 0 10*3/uL (ref 0.0–0.1)
BASOS PCT: 0 %
EOS ABS: 0.1 10*3/uL (ref 0.0–0.7)
Eosinophils Relative: 1 %
HEMATOCRIT: 41.6 % (ref 39.0–52.0)
HEMOGLOBIN: 15 g/dL (ref 13.0–17.0)
Lymphocytes Relative: 33 %
Lymphs Abs: 2.8 10*3/uL (ref 0.7–4.0)
MCH: 32.4 pg (ref 26.0–34.0)
MCHC: 36.1 g/dL — ABNORMAL HIGH (ref 30.0–36.0)
MCV: 89.8 fL (ref 78.0–100.0)
MONO ABS: 0.7 10*3/uL (ref 0.1–1.0)
Monocytes Relative: 8 %
NEUTROS ABS: 4.8 10*3/uL (ref 1.7–7.7)
NEUTROS PCT: 58 %
Platelets: 216 10*3/uL (ref 150–400)
RBC: 4.63 MIL/uL (ref 4.22–5.81)
RDW: 11.4 % — AB (ref 11.5–15.5)
WBC: 8.3 10*3/uL (ref 4.0–10.5)

## 2015-04-07 LAB — COMPREHENSIVE METABOLIC PANEL
ALBUMIN: 4.3 g/dL (ref 3.5–5.0)
ALT: 28 U/L (ref 17–63)
AST: 35 U/L (ref 15–41)
Alkaline Phosphatase: 88 U/L (ref 38–126)
Anion gap: 8 (ref 5–15)
BILIRUBIN TOTAL: 0.3 mg/dL (ref 0.3–1.2)
BUN: 10 mg/dL (ref 6–20)
CALCIUM: 9 mg/dL (ref 8.9–10.3)
CHLORIDE: 108 mmol/L (ref 101–111)
CO2: 27 mmol/L (ref 22–32)
CREATININE: 1.4 mg/dL — AB (ref 0.61–1.24)
GFR calc Af Amer: >60 mL/min (ref >60)
GFR calc non Af Amer: >60 mL/min (ref >60)
Glucose, Bld: 200 mg/dL — ABNORMAL HIGH (ref 65–99)
POTASSIUM: 4.1 mmol/L (ref 3.5–5.1)
SODIUM: 143 mmol/L (ref 135–145)
TOTAL PROTEIN: 6.9 g/dL (ref 6.5–8.1)

## 2015-04-07 LAB — URINE MICROSCOPIC-ADD ON: RBC / HPF: NONE SEEN RBC/hpf (ref 0–5)

## 2015-04-07 LAB — URINALYSIS, ROUTINE W REFLEX MICROSCOPIC
BILIRUBIN URINE: NEGATIVE
Glucose, UA: 250 mg/dL — AB
Hgb urine dipstick: NEGATIVE
KETONES UR: NEGATIVE mg/dL
LEUKOCYTES UA: NEGATIVE
NITRITE: NEGATIVE
PROTEIN: 100 mg/dL — AB
Specific Gravity, Urine: 1.015 (ref 1.005–1.030)
pH: 7 (ref 5.0–8.0)

## 2015-04-07 LAB — RAPID URINE DRUG SCREEN, HOSP PERFORMED
AMPHETAMINES: NOT DETECTED
Barbiturates: NOT DETECTED
Benzodiazepines: POSITIVE — AB
Cocaine: POSITIVE — AB
OPIATES: POSITIVE — AB
Tetrahydrocannabinol: NOT DETECTED

## 2015-04-07 LAB — SALICYLATE LEVEL

## 2015-04-07 LAB — ETHANOL

## 2015-04-07 LAB — ACETAMINOPHEN LEVEL

## 2015-04-07 MED ORDER — NALOXONE HCL 2 MG/2ML IJ SOSY
1.5000 mg/h | PREFILLED_SYRINGE | INTRAVENOUS | Status: DC
  Administered 2015-04-07: 1.5 mg/h via INTRAVENOUS
  Filled 2015-04-07: qty 4

## 2015-04-07 MED ORDER — NALOXONE HCL 0.4 MG/ML IJ SOLN
0.4000 mg | INTRAMUSCULAR | Status: DC | PRN
  Administered 2015-04-07: 2 mg via INTRAVENOUS

## 2015-04-07 MED ORDER — ACETAMINOPHEN 500 MG PO TABS
1000.0000 mg | ORAL_TABLET | Freq: Once | ORAL | Status: AC
  Administered 2015-04-07: 1000 mg via ORAL

## 2015-04-07 MED ORDER — NALOXONE HCL 0.4 MG/ML IJ SOLN
0.4000 mg | Freq: Once | INTRAMUSCULAR | Status: AC
  Administered 2015-04-07: 2 mg via INTRAVENOUS

## 2015-04-07 MED ORDER — ACETAMINOPHEN 500 MG PO TABS
ORAL_TABLET | ORAL | Status: AC
  Filled 2015-04-07: qty 2

## 2015-04-07 MED ORDER — SODIUM CHLORIDE 0.9 % IV BOLUS (SEPSIS)
1000.0000 mL | Freq: Once | INTRAVENOUS | Status: AC
  Administered 2015-04-07: 1000 mL via INTRAVENOUS

## 2015-04-07 NOTE — ED Notes (Signed)
Patient arrived by POV after heroin overdose today. Arrived apneic and cyanotic. Pulses present. MD at bedside on arrival with RT and RN.

## 2015-04-07 NOTE — ED Notes (Signed)
No changes.  Resting/ sleepier, NAD, calm, interactive, spontaneous, RR14, remains on monitor.

## 2015-04-07 NOTE — ED Notes (Signed)
Dr. Yelverton at BS 

## 2015-04-07 NOTE — ED Notes (Signed)
Dr. Ranae Palms at [facility], no changes, alert, NAD, calm, interactive, RR 12 (lower) will continue to monitor.

## 2015-04-07 NOTE — ED Notes (Signed)
NS infusing: 59ml/hr via infusion pump Narcan gtt infusing via infusion pump @ 93.7ml/hr (1.5mg /hr)

## 2015-04-07 NOTE — ED Notes (Signed)
No changes, alert, NAD, calm, interactive, VSS.  

## 2015-04-07 NOTE — ED Notes (Signed)
Pt now c/o HA, requesting medication

## 2015-04-07 NOTE — ED Provider Notes (Signed)
CSN: [id number]     Arrival date & time 04/07/15  1809 History  By signing my name below, I, Joshua Mcdowell, attest that this documentation has been prepared under the direction and in the presence of Loren Racer, MD. Electronically Signed: Budd Mcdowell, ED Scribe. 04/07/2015. 6:22 PM.    Chief Complaint  Patient presents with  . Drug Overdose   The history is provided by the patient and the police. No language interpreter was used.   HPI Comments: Level 5 Caveat (Acuity of Condition) Joshua Mcdowell is a 35 y.o. male smoker with a PMHx of depression and anxiety who presents to the Emergency Department apneic and cyanotic. Per police officer, pt was dropped off by a friend, who then left.   Past Medical History  Diagnosis Date  . Depression   . Anxiety    Past Surgical History  Procedure Laterality Date  . Orif ankle fracture    . Wisdom tooth extraction     No family history on file. Social History  Substance Use Topics  . Smoking status: Current Every Day Smoker  . Smokeless tobacco: None  . Alcohol Use: No    Review of Systems  Unable to perform ROS: Acuity of condition    Allergies  Review of patient's allergies indicates no known allergies.  Home Medications   Prior to Admission medications   Not on File   BP 122/76 mmHg  Pulse 70  Temp(Src) 97.3 F (36.3 C) (Oral)  Resp 15  Ht  (1.803 m)  Wt 165 lb (74.844 kg)  BMI 23.02 kg/m2  SpO2 97% Physical Exam  Constitutional: He appears well-developed and well-nourished. No distress.  No respiratory effort. Cyanotic  HENT:  Head: Normocephalic and atraumatic.  Mouth/Throat: Oropharynx is clear and moist.  Eyes:  Bilateral pinpoint pupils minimally reactive  Neck: Normal range of motion. Neck supple.  Cardiovascular: Normal rate and regular rhythm.   Pulmonary/Chest:  No respiratory effort  Abdominal: Soft. Bowel sounds are normal. He exhibits no distension and no mass. There is no tenderness.  There is no rebound and no guarding.  Musculoskeletal: Normal range of motion. He exhibits no edema or tenderness.  No obvious track marks. Distal pulses are equal and intact. No evidence of trauma  Neurological:  GCS 3. No spontaneous movement.  Skin: No rash noted. No erythema.  Skin is cool  Nursing note and vitals reviewed.   ED Course  Procedures  DIAGNOSTIC STUDIES: Oxygen Saturation is 100% on RA, normal by my interpretation.    COORDINATION OF CARE: 6:14 PM - Discussed plans to order diagnostic studies and IV fluids. Pt advised of plan for treatment and pt agrees.  CRITICAL CARE Performed by: Loren Racer, MD Total critical care time: 10 minutes Critical care time was exclusive of separately billable procedures and treating other patients. Critical care was necessary to treat or prevent imminent or life-threatening deterioration. Critical care was time spent personally by me on the following activities: development of treatment plan with patient and/or surrogate as well as nursing, discussions with consultants, evaluation of patient's response to treatment, examination of patient, obtaining history from patient or surrogate, ordering and performing treatments and interventions, ordering and review of laboratory studies, ordering and review of radiographic studies, pulse oximetry and re-evaluation of patient's condition.  Labs Review Labs Reviewed  CBC WITH DIFFERENTIAL/PLATELET - Abnormal; Notable for the following:    MCHC 36.1 (*)    RDW 11.4 (*)    All other components within normal  limits  COMPREHENSIVE METABOLIC PANEL - Abnormal; Notable for the following:    Glucose, Bld 200 (*)    Creatinine, Ser 1.40 (*)    All other components within normal limits  URINALYSIS, ROUTINE W REFLEX MICROSCOPIC (NOT AT [facility]) - Abnormal; Notable for the following:    Glucose, UA 250 (*)    Protein, ur 100 (*)    All other components within normal limits  ACETAMINOPHEN LEVEL -  Abnormal; Notable for the following:    Acetaminophen (Tylenol), Serum <10 (*)    All other components within normal limits  URINE RAPID DRUG SCREEN, HOSP PERFORMED - Abnormal; Notable for the following:    Opiates POSITIVE (*)    Cocaine POSITIVE (*)    Benzodiazepines POSITIVE (*)    All other components within normal limits  URINE MICROSCOPIC-ADD ON - Abnormal; Notable for the following:    Squamous Epithelial / LPF 0-5 (*)    Bacteria, UA MANY (*)    Casts HYALINE CASTS (*)    All other components within normal limits  ETHANOL  SALICYLATE LEVEL    Imaging Review No results found. I have personally reviewed and evaluated these images and lab results as part of my medical decision-making.   EKG Interpretation   Date/Time:  Sunday April 07 2015 18:10:39 EST Ventricular Rate:  108 PR Interval:  144 QRS Duration: 100 QT Interval:  348 QTC Calculation: 466 R Axis:   92 Text Interpretation:  Age not entered, assumed to be  35 years old for  purpose of ECG interpretation Sinus tachycardia Ventricular premature  complex Biatrial enlargement Borderline right axis deviation Borderline ST  depression, diffuse leads Baseline wander in lead(s) II III aVL aVF V1 V6  Confirmed by Ranae Palms  MD, Sariah Henkin ([id number]) on 04/07/2015 6:35:46 PM      MDM   Final diagnoses:  Heroin overdose, accidental or unintentional, initial encounter   Patient given airway support with BVM. Improvement of cyanosis. Patient was given 2 mg of IM Narcan with little response. He then was given 2 mg of IV Narcan after left antecubital IV was established. The patient with spontaneous respirations after IV meds. He became more awake. Pupils are 4 mm bilaterally. Following simple commands. Moving all extremities. We'll start Narcan drip due to concern for recurrence of apnea.  Patient notes to injecting heroin shortly for before arrival. Narcan drip has been stopped and patient will be observed in the emergency  department. If maintains alertness and protecting airway, anticipate discharge home. He's been given strong warnings to avoid heroin and all illegal drugs.   Patient remains alert. Stable vital signs. He's been off for Narcan drip 3 hours now. He is eating and drinking without issue. We'll discharge with sober friend.  Loren Racer, MD 04/07/15 2258

## 2015-04-07 NOTE — Discharge Instructions (Signed)
Narcotic Overdose °A narcotic overdose is the misuse or overuse of a narcotic drug. A narcotic overdose can make you pass out and stop breathing. If you are not treated right away, this can cause permanent brain damage or stop your heart. Medicine may be given to reverse the effects of an overdose. If so, this medicine may bring on withdrawal symptoms. The symptoms may be abdominal cramps, throwing up (vomiting), sweating, chills, and nervousness. °Injecting narcotics can cause more problems than just an overdose. AIDS, hepatitis, and other very serious infections are transmitted by sharing needles and syringes. If you decide to quit using, there are medicines which can help you through the withdrawal period. Trying to quit all at once on your own can be uncomfortable, but not life-threatening. Call your caregiver, Narcotics Anonymous, or any drug and alcohol treatment program for further help.  °  °This information is not intended to replace advice given to you by your health care provider. Make sure you discuss any questions you have with your health care provider. °  °Document Released: 04/09/2004 Document Revised: 03/23/2014 Document Reviewed: 08/22/2014 °Elsevier Interactive Patient Education ©2016 [employer] ° °

## 2015-04-07 NOTE — ED Notes (Signed)
Tolerating soda, given more soda and crackers

## 2015-04-07 NOTE — ED Notes (Signed)
Pt on cont cardiac monitoring with cont POX monitoring and int NBP q61min

## 2015-04-07 NOTE — ED Notes (Signed)
Pt alert, spontaneous, NAD, calm, interactive, skin W&D, resps e/u, no dyspnea noted, speaking in clear complete sentences, VSS, narcan infusing, drinking soda, CBIR, pending arrival of friend.

## 2018-09-23 ENCOUNTER — Emergency Department (HOSPITAL_COMMUNITY): Payer: Self-pay

## 2018-09-23 ENCOUNTER — Other Ambulatory Visit: Payer: Self-pay

## 2018-09-23 ENCOUNTER — Encounter (HOSPITAL_COMMUNITY): Payer: Self-pay

## 2018-09-23 ENCOUNTER — Inpatient Hospital Stay (HOSPITAL_COMMUNITY)
Admission: EM | Admit: 2018-09-23 | Discharge: 2018-09-27 | DRG: 896 | Disposition: A | Discharge status: Other Acute Inpatient Hospital | Payer: Self-pay | Attending: Nephrology | Admitting: Nephrology

## 2018-09-23 DIAGNOSIS — G9341 Metabolic encephalopathy: Secondary | ICD-10-CM | POA: Diagnosis present

## 2018-09-23 DIAGNOSIS — Z720 Tobacco use: Secondary | ICD-10-CM | POA: Diagnosis present

## 2018-09-23 DIAGNOSIS — F329 Major depressive disorder, single episode, unspecified: Secondary | ICD-10-CM | POA: Diagnosis present

## 2018-09-23 DIAGNOSIS — N179 Acute kidney failure, unspecified: Secondary | ICD-10-CM | POA: Diagnosis present

## 2018-09-23 DIAGNOSIS — F1910 Other psychoactive substance abuse, uncomplicated: Secondary | ICD-10-CM | POA: Diagnosis present

## 2018-09-23 DIAGNOSIS — E876 Hypokalemia: Secondary | ICD-10-CM

## 2018-09-23 DIAGNOSIS — F1124 Opioid dependence with opioid-induced mood disorder: Secondary | ICD-10-CM

## 2018-09-23 DIAGNOSIS — G92 Toxic encephalopathy: Secondary | ICD-10-CM | POA: Diagnosis present

## 2018-09-23 DIAGNOSIS — F331 Major depressive disorder, recurrent, moderate: Secondary | ICD-10-CM

## 2018-09-23 DIAGNOSIS — K08409 Partial loss of teeth, unspecified cause, unspecified class: Secondary | ICD-10-CM | POA: Diagnosis present

## 2018-09-23 DIAGNOSIS — F1090 Alcohol use, unspecified, uncomplicated: Secondary | ICD-10-CM

## 2018-09-23 DIAGNOSIS — F1193 Opioid use, unspecified with withdrawal: Secondary | ICD-10-CM

## 2018-09-23 DIAGNOSIS — E860 Dehydration: Secondary | ICD-10-CM | POA: Diagnosis present

## 2018-09-23 DIAGNOSIS — E870 Hyperosmolality and hypernatremia: Secondary | ICD-10-CM

## 2018-09-23 DIAGNOSIS — D72829 Elevated white blood cell count, unspecified: Secondary | ICD-10-CM | POA: Diagnosis present

## 2018-09-23 DIAGNOSIS — F32A Depression, unspecified: Secondary | ICD-10-CM | POA: Diagnosis present

## 2018-09-23 DIAGNOSIS — R4689 Other symptoms and signs involving appearance and behavior: Secondary | ICD-10-CM

## 2018-09-23 DIAGNOSIS — Z1159 Encounter for screening for other viral diseases: Secondary | ICD-10-CM

## 2018-09-23 DIAGNOSIS — F419 Anxiety disorder, unspecified: Secondary | ICD-10-CM | POA: Diagnosis present

## 2018-09-23 DIAGNOSIS — F17200 Nicotine dependence, unspecified, uncomplicated: Secondary | ICD-10-CM | POA: Diagnosis present

## 2018-09-23 DIAGNOSIS — F10239 Alcohol dependence with withdrawal, unspecified: Principal | ICD-10-CM | POA: Diagnosis present

## 2018-09-23 DIAGNOSIS — F1123 Opioid dependence with withdrawal: Secondary | ICD-10-CM | POA: Diagnosis not present

## 2018-09-23 DIAGNOSIS — Z789 Other specified health status: Secondary | ICD-10-CM

## 2018-09-23 DIAGNOSIS — F1410 Cocaine abuse, uncomplicated: Secondary | ICD-10-CM | POA: Diagnosis present

## 2018-09-23 DIAGNOSIS — R4182 Altered mental status, unspecified: Secondary | ICD-10-CM

## 2018-09-23 DIAGNOSIS — Z7289 Other problems related to lifestyle: Secondary | ICD-10-CM

## 2018-09-23 LAB — COMPREHENSIVE METABOLIC PANEL
ALT: 23 U/L (ref 0–44)
AST: 20 U/L (ref 15–41)
Albumin: 5.2 g/dL — ABNORMAL HIGH (ref 3.5–5.0)
Alkaline Phosphatase: 60 U/L (ref 38–126)
Anion gap: 14 (ref 5–15)
BUN: 34 mg/dL — ABNORMAL HIGH (ref 6–20)
CO2: 23 mmol/L (ref 22–32)
Calcium: 9.4 mg/dL (ref 8.9–10.3)
Chloride: 109 mmol/L (ref 98–111)
Creatinine, Ser: 1.34 mg/dL — ABNORMAL HIGH (ref 0.61–1.24)
GFR calc Af Amer: >60 mL/min (ref >60)
GFR calc non Af Amer: >60 mL/min (ref >60)
Glucose, Bld: 130 mg/dL — ABNORMAL HIGH (ref 70–99)
Potassium: 4.2 mmol/L (ref 3.5–5.1)
Sodium: 146 mmol/L — ABNORMAL HIGH (ref 135–145)
Total Bilirubin: 0.8 mg/dL (ref 0.3–1.2)
Total Protein: 8.5 g/dL — ABNORMAL HIGH (ref 6.5–8.1)

## 2018-09-23 LAB — SALICYLATE LEVEL: Salicylate Lvl: <7 mg/dL (ref 2.8–30.0)

## 2018-09-23 LAB — CBC
HCT: 45.3 % (ref 39.0–52.0)
Hemoglobin: 15.9 g/dL (ref 13.0–17.0)
MCH: 31.5 pg (ref 26.0–34.0)
MCHC: 35.1 g/dL (ref 30.0–36.0)
MCV: 89.9 fL (ref 80.0–100.0)
Platelets: 218 10*3/uL (ref 150–400)
RBC: 5.04 MIL/uL (ref 4.22–5.81)
RDW: 12.7 % (ref 11.5–15.5)
WBC: 14.4 10*3/uL — ABNORMAL HIGH (ref 4.0–10.5)
nRBC: 0 % (ref 0.0–0.2)

## 2018-09-23 LAB — ETHANOL: Alcohol, Ethyl (B): <10 mg/dL (ref <10)

## 2018-09-23 LAB — CK: Total CK: 328 U/L (ref 49–397)

## 2018-09-23 LAB — SARS CORONAVIRUS 2 BY RT PCR (HOSPITAL ORDER, PERFORMED IN ~~LOC~~ HOSPITAL LAB): SARS Coronavirus 2: NEGATIVE

## 2018-09-23 LAB — ACETAMINOPHEN LEVEL: Acetaminophen (Tylenol), Serum: <10 ug/mL — ABNORMAL LOW (ref 10–30)

## 2018-09-23 MED ORDER — ADULT MULTIVITAMIN W/MINERALS CH
1.0000 | ORAL_TABLET | Freq: Every day | ORAL | Status: DC
  Administered 2018-09-24 – 2018-09-27 (×4): 1 via ORAL
  Filled 2018-09-23 (×4): qty 1

## 2018-09-23 MED ORDER — LORAZEPAM 1 MG PO TABS
1.0000 mg | ORAL_TABLET | Freq: Four times a day (QID) | ORAL | Status: AC | PRN
  Administered 2018-09-24 – 2018-09-26 (×2): 1 mg via ORAL
  Filled 2018-09-23 (×2): qty 1

## 2018-09-23 MED ORDER — VITAMIN B-1 100 MG PO TABS
100.0000 mg | ORAL_TABLET | Freq: Every day | ORAL | Status: DC
  Administered 2018-09-24 – 2018-09-27 (×4): 100 mg via ORAL
  Filled 2018-09-23 (×4): qty 1

## 2018-09-23 MED ORDER — LORAZEPAM 2 MG/ML IJ SOLN: 0.0000 mg | Freq: Two times a day (BID) | INTRAMUSCULAR | Status: DC

## 2018-09-23 MED ORDER — SODIUM CHLORIDE 0.9 % IV BOLUS
1000.0000 mL | Freq: Once | INTRAVENOUS | Status: AC
  Administered 2018-09-23: 21:00:00 1000 mL via INTRAVENOUS

## 2018-09-23 MED ORDER — LORAZEPAM 2 MG/ML IJ SOLN
1.0000 mg | Freq: Four times a day (QID) | INTRAMUSCULAR | Status: AC | PRN
  Administered 2018-09-26: 1 mg via INTRAVENOUS
  Filled 2018-09-23: qty 1

## 2018-09-23 MED ORDER — LORAZEPAM 2 MG/ML IJ SOLN
0.0000 mg | Freq: Four times a day (QID) | INTRAMUSCULAR | Status: DC
  Administered 2018-09-24 (×2): 1 mg via INTRAVENOUS
  Filled 2018-09-23 (×2): qty 1

## 2018-09-23 MED ORDER — FOLIC ACID 1 MG PO TABS
1.0000 mg | ORAL_TABLET | Freq: Every day | ORAL | Status: DC
  Administered 2018-09-24 – 2018-09-27 (×4): 1 mg via ORAL
  Filled 2018-09-23 (×4): qty 1

## 2018-09-23 MED ORDER — LORAZEPAM 2 MG/ML IJ SOLN
1.0000 mg | Freq: Once | INTRAMUSCULAR | Status: AC
  Administered 2018-09-23: 1 mg via INTRAVENOUS
  Filled 2018-09-23: qty 1

## 2018-09-23 MED ORDER — THIAMINE HCL 100 MG/ML IJ SOLN: 100.0000 mg | Freq: Every day | INTRAMUSCULAR | Status: DC

## 2018-09-23 MED ORDER — SODIUM CHLORIDE 0.9 % IV BOLUS
1000.0000 mL | Freq: Once | INTRAVENOUS | Status: AC
  Administered 2018-09-23: 18:00:00 1000 mL via INTRAVENOUS

## 2018-09-23 NOTE — ED Notes (Signed)
Pt has not urinated since arrival. RN asked if pt would allow an in and out cath for ordered urinalysis. Pt agreed. During in and out cath pt pushed RN away aggressively and called out "NO". Pt told he may not touch staff. Pt reminded he agreed to allow staff to do in and out cath. In and out cath unsuccessful due to pt not allowing staff to touch him. Pt repeatedly pushed away RN and Tech, staff informed pt that he may not touch staff or security would be called.

## 2018-09-23 NOTE — ED Provider Notes (Signed)
[facility] Emergency Department Provider Note MRN:  [id number]  Arrival date & time: 09/23/18     Chief Complaint   Altered Mental Status   History of Present Illness   Randall HissMatthew Canizalez is a 38 y.o. year-old male with unknown past medical history presenting to the ED with chief complaint of altered mental status.  Patient reportedly has not been getting out of bed for 2 days, found with drugs next to him by EMS.    I was unable to obtain an accurate HPI, PMH, or ROS due to the patient's altered mental status.  Review of Systems  A complete 10 system review of systems was obtained and all systems are negative except as noted in the HPI and PMH.   Patient's Health History    Past Medical History:  Diagnosis Date  . Anxiety   . Depression     Past Surgical History:  Procedure Laterality Date  . ORIF ANKLE FRACTURE    . WISDOM TOOTH EXTRACTION      History reviewed. No pertinent family history.  Social History   Socioeconomic History  . Marital status: Single    Spouse name: Not on file  . Number of children: Not on file  . Years of education: Not on file  . Highest education level: Not on file  Occupational History  . Not on file  Social Needs  . Financial resource strain: Not on file  . Food insecurity    Worry: Not on file    Inability: Not on file  . Transportation needs    Medical: Not on file    Non-medical: Not on file  Tobacco Use  . Smoking status: Current Every Day Smoker  . Smokeless tobacco: Never Used  Substance and Sexual Activity  . Alcohol use: Yes  . Drug use: Yes    Types: Cocaine, Heroin    Comment: narcotics, heroine  . Sexual activity: Not on file  Lifestyle  . Physical activity    Days per week: Not on file    Minutes per session: Not on file  . Stress: Not on file  Relationships  . Social Musicianconnections    Talks on phone: Not on file    Gets together: Not on file    Attends religious service: Not on file    Active  member of club or organization: Not on file    Attends meetings of clubs or organizations: Not on file    Relationship status: Not on file  . Intimate partner violence    Fear of current or ex partner: Not on file    Emotionally abused: Not on file    Physically abused: Not on file    Forced sexual activity: Not on file  Other Topics Concern  . Not on file  Social History Narrative  . Not on file     Physical Exam  Vital Signs and Nursing Notes reviewed Vitals:   09/23/18 2245 09/23/18 2300  BP: (!) 123/93 126/89  Pulse: 60 (!) 58  Resp: (!) 28 (!) 29  Temp:    SpO2: 99% 99%    CONSTITUTIONAL: Ill-appearing, NAD NEURO: Somnolent, oriented to name, moving all extremities, confused EYES:  eyes equal and reactive ENT/NECK:  no LAD, no JVD CARDIO: Regular rate, well-perfused, normal S1 and S2 PULM:  CTAB no wheezing or rhonchi GI/GU:  normal bowel sounds, non-distended, non-tender MSK/SPINE:  No gross deformities, no edema SKIN:  no rash, atraumatic PSYCH:  Appropriate speech and behavior  Diagnostic and Interventional Summary    EKG Interpretation  Date/Time:  Friday September 23 2018 17:51:42 EDT Ventricular Rate:  63 PR Interval:    QRS Duration: 98 QT Interval:  439 QTC Calculation: 450 R Axis:   86 Text Interpretation:  Sinus rhythm Confirmed by Gerlene Fee [phone]) on 09/23/2018 6:49:13 PM      Labs Reviewed  CBC - Abnormal; Notable for the following components:      Result Value   WBC 14.4 (*)    All other components within normal limits  ACETAMINOPHEN LEVEL - Abnormal; Notable for the following components:   Acetaminophen (Tylenol), Serum <10 (*)    All other components within normal limits  COMPREHENSIVE METABOLIC PANEL - Abnormal; Notable for the following components:   Sodium 146 (*)    Glucose, Bld 130 (*)    BUN 34 (*)    Creatinine, Ser 1.34 (*)    Total Protein 8.5 (*)    Albumin 5.2 (*)    All other components within normal limits  SARS  CORONAVIRUS 2 (HOSPITAL ORDER, [place] [facility])  ETHANOL  SALICYLATE LEVEL  CK  URINALYSIS, ROUTINE W REFLEX MICROSCOPIC  RAPID URINE DRUG SCREEN, HOSP PERFORMED    DG Chest Port 1 View  Final Result    CT HEAD WO CONTRAST  Final Result      Medications  LORazepam (ATIVAN) injection 1 mg (has no administration in time range)  LORazepam (ATIVAN) tablet 1 mg (has no administration in time range)    Or  LORazepam (ATIVAN) injection 1 mg (has no administration in time range)  thiamine (VITAMIN B-1) tablet 100 mg (has no administration in time range)    Or  thiamine (B-1) injection 100 mg (has no administration in time range)  folic acid (FOLVITE) tablet 1 mg (has no administration in time range)  multivitamin with minerals tablet 1 tablet (has no administration in time range)  LORazepam (ATIVAN) injection 0-4 mg (has no administration in time range)    Followed by  LORazepam (ATIVAN) injection 0-4 mg (has no administration in time range)  sodium chloride 0.9 % bolus 1,000 mL (0 mLs Intravenous Stopped 09/23/18 2009)  sodium chloride 0.9 % bolus 1,000 mL (0 mLs Intravenous Stopped 09/23/18 2236)     Procedures Critical Care  ED Course and Medical Decision Making  I have reviewed the triage vital signs and the nursing notes.  Pertinent labs & imaging results that were available during my care of the patient were reviewed by me and considered in my medical decision making (see below for details).  Concern for drug overdose in this 38 year old male with history of the same, moving all extremities, intermittently conversant but very confused.  Work-up is pending.  Work-up is largely unrevealing, normal head CT, labs largely unremarkable.  After 6 hours patient is still altered, question of continued drug toxicity or overdose, admitted to hospital service for further care.  Barth Kirks. Sedonia Small, [place]  [email or web address]  Final Clinical Impressions(s) / ED Diagnoses     ICD-10-CM   1. Altered mental status, unspecified altered mental status type  R41.82   2. Altered behavior  R46.89 DG Chest [facility] 1 View    DG Chest Port 1 View  3. Dehydration  E86.0     ED Discharge Orders    None         Maudie Flakes, MD 09/23/18 [phone]

## 2018-09-23 NOTE — ED Triage Notes (Signed)
Pt BIB EMS from home. Pt roommate called EMS, reports pt has not moved from bed in 2 days. Pt has been incontinent for 2 days at home in bed. Pt denies taking medications. EMS reports pt has been twitching and pt did not want to go with EMS at first. EMS reports a bowl of white powder substance by bed.   128/92 97% RA CBG 123

## 2018-09-23 NOTE — ED Notes (Signed)
Dr. Sedonia Small pt has not voided since arrival and bladder scan done.

## 2018-09-23 NOTE — ED Notes (Signed)
New condom cath placed on patient

## 2018-09-23 NOTE — ED Notes (Signed)
Pt came in covered in fecal matter.  We cleaned him up and put on a brief and condom catheter.

## 2018-09-23 NOTE — H&P (Signed)
History and Physical    Joshua Mcdowell JXB:[id number] DOB: 10/04/1980 DOA: 09/23/2018  Referring MD/NP/PA:   PCP: Patient, No Pcp Per   Patient coming from:  The patient is coming from home.  At baseline, pt is independent for most of ADL.        Chief Complaint: AMS  HPI: Joshua Mcdowell is a 38 y.o. male with medical history significant of polysubstance abuse (tobacco, alcohol, cocaine, heroin), depression, anxiety, who presents with altered mental status.  Patient has AMS, and is unable to provide accurate medical history, therefore, most of the history is obtained by discussing the case with ED physician, per EMS report, and with the nursing staff.  Per EMS report pt's roommate called EMS, and reported that pt has not moved from bed in 2 days. Pt has been confused and covered with feces. When I saw pt in ED, he is confused, knows his own name, knows he is in hospital, but not oriented to the time.  He moves all extremities normally.  No facial droop or slurred speech.  He is tremulous.  I asked him if he drinks alcohol and he answered yes.  He cannot give detailed information about drinking history.  Patient does not have active nausea, vomiting or diarrhea noted.  No active respiratory distress, cough noted.  Not sure if patient has symptoms of UTI.  ED Course: pt was found to have WBC 14.4, negative urinalysis, negative COVID-19 test, CK 328, alcohol level less than 10, Tylenol level less than 10, salicylate level less than 7, AKI with creatinine 1.34, BUN 14, temperature normal, blood pressure 128/88, heart rate 50 to 60s, oxygen saturation 99% on room air.  Chest x-ray negative.  CT head is negative for acute intracranial abnormalities.  Review of Systems: Could not be reviewed accurately due to altered mental status.  Allergy: No Known Allergies  Past Medical History:  Diagnosis Date   Anxiety    Depression     Past Surgical History:  Procedure Laterality Date   ORIF ANKLE  FRACTURE     WISDOM TOOTH EXTRACTION      Social History:  reports that he has been smoking. He has never used smokeless tobacco. He reports current alcohol use. He reports current drug use. Drugs: Cocaine and Heroin.  Family History: History reviewed. No pertinent family history.  Cannot be reviewed accurately due to altered mental status.  Prior to Admission medications   Not on File    Physical Exam: Vitals:   09/23/18 2300 09/24/18 0029 09/24/18 0101 09/24/18 0102  BP: 126/89 (!) 128/94 (!) 128/91 (!) 128/91  Pulse: (!) 58 (!) 57 (!) 54 (!) 54  Resp: (!) 29 (!) 31 20   Temp:      TempSrc:      SpO2: 99% 99% 98%    General: Not in acute distress.  Patient is tremulous HEENT:       Eyes: PERRL, EOMI, no scleral icterus.       ENT: No discharge from the ears and nose, no pharynx injection, no tonsillar enlargement.        Neck: No JVD, no bruit, no mass felt. Heme: No neck lymph node enlargement. Cardiac: S1/S2, RRR, No murmurs, No gallops or rubs. Respiratory: No rales, wheezing, rhonchi or rubs. GI: Soft, nondistended, nontender, no organomegaly, BS present. GU: No hematuria Ext: No pitting leg edema bilaterally. 2+DP/PT pulse bilaterally. Musculoskeletal: No joint deformities, No joint redness or warmth, no limitation of ROM in spin. Skin: No  rashes.  Neuro: confused, knows his own name, knows he is in hospital, but not oriented to the time. cranial nerves II-XII grossly intact, moves all extremities normally.  Psych: Patient is not psychotic, no suicidal or hemocidal ideation.  Labs on Admission: I have personally reviewed following labs and imaging studies  CBC: Recent Labs  Lab 09/23/18 1746  WBC 14.4*  HGB 15.9  HCT 45.3  MCV 89.9  PLT 425   Basic Metabolic Panel: Recent Labs  Lab 09/23/18 1830  NA 146*  K 4.2  CL 109  CO2 23  GLUCOSE 130*  BUN 34*  CREATININE 1.34*  CALCIUM 9.4   GFR: CrCl cannot be calculated (Unknown ideal weight.). Liver  Function Tests: Recent Labs  Lab 09/23/18 1830  AST 20  ALT 23  ALKPHOS 60  BILITOT 0.8  PROT 8.5*  ALBUMIN 5.2*   No results for input(s): LIPASE, AMYLASE in the last 168 hours. No results for input(s): AMMONIA in the last 168 hours. Coagulation Profile: No results for input(s): INR, PROTIME in the last 168 hours. Cardiac Enzymes: Recent Labs  Lab 09/23/18 1830  CKTOTAL 328   BNP (last 3 results) No results for input(s): PROBNP in the last 8760 hours. HbA1C: No results for input(s): HGBA1C in the last 72 hours. CBG: No results for input(s): GLUCAP in the last 168 hours. Lipid Profile: No results for input(s): CHOL, HDL, LDLCALC, TRIG, CHOLHDL, LDLDIRECT in the last 72 hours. Thyroid Function Tests: No results for input(s): TSH, T4TOTAL, FREET4, T3FREE, THYROIDAB in the last 72 hours. Anemia Panel: No results for input(s): VITAMINB12, FOLATE, FERRITIN, TIBC, IRON, RETICCTPCT in the last 72 hours. Urine analysis:    Component Value Date/Time   COLORURINE YELLOW 09/23/2018 2351   APPEARANCEUR CLEAR 09/23/2018 2351   LABSPEC 1.030 09/23/2018 2351   PHURINE 6.0 09/23/2018 2351   GLUCOSEU NEGATIVE 09/23/2018 2351   HGBUR NEGATIVE 09/23/2018 2351   BILIRUBINUR NEGATIVE 09/23/2018 2351   KETONESUR NEGATIVE 09/23/2018 2351   PROTEINUR 30 (A) 09/23/2018 2351   NITRITE NEGATIVE 09/23/2018 2351   LEUKOCYTESUR NEGATIVE 09/23/2018 2351   Sepsis Labs: @LABRCNTIP (procalcitonin:4,lacticidven:4) ) Recent Results (from the past 240 hour(s))  SARS Coronavirus 2 (CEPHEID - Performed in [place] hospital lab), Hosp Order     Status: None   Collection Time: 09/23/18  5:47 PM   Specimen: Nasopharyngeal Swab  Result Value Ref Range Status   SARS Coronavirus 2 NEGATIVE NEGATIVE Final    Comment: (NOTE) If result is NEGATIVE SARS-CoV-2 target nucleic acids are NOT DETECTED. The SARS-CoV-2 RNA is generally detectable in upper and lower  respiratory specimens during the acute  phase of infection. The lowest  concentration of SARS-CoV-2 viral copies this assay can detect is 250  copies / mL. A negative result does not preclude SARS-CoV-2 infection  and should not be used as the sole basis for treatment or other  patient management decisions.  A negative result may occur with  improper specimen collection / handling, submission of specimen other  than nasopharyngeal swab, presence of viral mutation(s) within the  areas targeted by this assay, and inadequate number of viral copies  (<250 copies / mL). A negative result must be combined with clinical  observations, patient history, and epidemiological information. If result is POSITIVE SARS-CoV-2 target nucleic acids are DETECTED. The SARS-CoV-2 RNA is generally detectable in upper and lower  respiratory specimens dur ing the acute phase of infection.  Positive  results are indicative of active infection with SARS-CoV-2.  Clinical  correlation with patient history and other diagnostic information is  necessary to determine patient infection status.  Positive results do  not rule out bacterial infection or co-infection with other viruses. If result is PRESUMPTIVE POSTIVE SARS-CoV-2 nucleic acids MAY BE PRESENT.   A presumptive positive result was obtained on the submitted specimen  and confirmed on repeat testing.  While 2019 novel coronavirus  (SARS-CoV-2) nucleic acids may be present in the submitted sample  additional confirmatory testing may be necessary for epidemiological  and / or clinical management purposes  to differentiate between  SARS-CoV-2 and other Sarbecovirus currently known to infect humans.  If clinically indicated additional testing with an alternate test  methodology [phone]([id number]) is advised. The SARS-CoV-2 RNA is generally  detectable in upper and lower respiratory sp ecimens during the acute  phase of infection. The expected result is Negative. Fact Sheet for Patients:   BoilerBrush.com.cyhttps://www.fda.gov/media/136312/download Fact Sheet for Healthcare Providers: https://pope.com/https://www.fda.gov/media/136313/download This test is not yet approved or cleared by the [place] FDA and has been authorized for detection and/or diagnosis of SARS-CoV-2 by FDA under an Emergency Use Authorization (EUA).  This EUA will remain in effect (meaning this test can be used) for the duration of the COVID-19 declaration under Section 564(b)(1) of the Act, 21 U.S.C. section 360bbb-3(b)(1), unless the authorization is terminated or revoked sooner. Performed at [facility], [address] [place], [place] [address]      Radiological Exams on Admission: Ct Head Wo Contrast  Result Date: 09/23/2018 CLINICAL DATA:  Incontinence.  Altered consciousness. EXAM: CT HEAD WITHOUT CONTRAST TECHNIQUE: Contiguous axial images were obtained from the base of the skull through the vertex without intravenous contrast. COMPARISON:  January 16, 2012 FINDINGS: Brain: No evidence of acute infarction, hemorrhage, hydrocephalus, extra-axial collection or mass lesion/mass effect. Vascular: No hyperdense vessel or unexpected calcification. Skull: Normal. Negative for fracture or focal lesion. Sinuses/Orbits: No acute finding. Other: None. IMPRESSION: No acute intracranial abnormalities identified. Electronically Signed   By: Gerome Samavid  Williams III M.D   On: 09/23/2018 18:11   Dg Chest Port 1 View  Result Date: 09/23/2018 CLINICAL DATA:  Altered mental status. EXAM: PORTABLE CHEST 1 VIEW COMPARISON:  None. FINDINGS: The heart size and mediastinal contours are within normal limits. Both lungs are clear. The visualized skeletal structures are unremarkable. IMPRESSION: No active disease. Electronically Signed   By: Katherine Mantlehristopher  Green M.D.   On: 09/23/2018 18:12     EKG: Independently reviewed.  Sinus rhythm, QTC 450, mild T wave inversion in V1-V2, early R wave progression.   Assessment/Plan Principal  Problem:   Acute metabolic encephalopathy Active Problems:   Depression   Anxiety   AKI (acute kidney injury) (HCC)   Leukocytosis   Polysubstance abuse (HCC)   Tobacco abuse   Alcohol use   Acute metabolic encephalopathy: Etiology is not clear.  CT head is negative for acute intracranial abnormalities.  No focal neurologic findings on physical examination.  Patient is tremulous on interview, indicating possible alcohol withdrawal.  Other differential diagnosis include polysubstance abuse.  Patient has leukocytosis with WBC 14.4, but no fever.  Low suspicions for meningitis.  Clinically does not seem to have sepsis.  Urinalysis negative.  Chest x-ray negative.  - Placed on telemetry bed for observation - N.p.o. until mental status improves - Frequent neuro check -Start CIWA protocol  Depression and Anxiety: not taking medications at home -will start Zoloft -pt is on CiWA protocol and will receive ativan.  AKI: Likely due  to prerenal secondary to dehydration - IVF: 2L NS and then D5-1/2 NS at 125 cc/h - Follow up renal function by BMP  Leukocytosis: WBC 14.4.  No fever.  Urinalysis negative.  Chest x-ray negative.  Clinically not septic.  May be due to reactive response per - Follow-up urinalysis, urine culture and blood culture.   -Hold off antibiotics now.  Polysubstance abuse (cocaine, heroin, tobacco, alcohol): -check UDS -on CiWA protocol -Nicotine patch -need counseling about importance of quitting substance use when mental status improves.   DVT ppx: SQ Lovenox Code Status: Full code Family Communication: None at bed side.     Disposition Plan:  Anticipate discharge back to previous home environment Consults called:  none Admission status: Obs / tele     Date of Service 09/24/2018    Lorretta HarpXilin Maudell Stanbrough [facility]   If 7PM-7AM, please contact night-coverage [email or web address] Password [email or web address] 09/24/2018, 1:13 AM

## 2018-09-24 DIAGNOSIS — Z720 Tobacco use: Secondary | ICD-10-CM | POA: Diagnosis present

## 2018-09-24 DIAGNOSIS — F331 Major depressive disorder, recurrent, moderate: Secondary | ICD-10-CM

## 2018-09-24 DIAGNOSIS — Z789 Other specified health status: Secondary | ICD-10-CM

## 2018-09-24 DIAGNOSIS — R4689 Other symptoms and signs involving appearance and behavior: Secondary | ICD-10-CM

## 2018-09-24 DIAGNOSIS — Z7289 Other problems related to lifestyle: Secondary | ICD-10-CM

## 2018-09-24 DIAGNOSIS — F1124 Opioid dependence with opioid-induced mood disorder: Secondary | ICD-10-CM

## 2018-09-24 LAB — URINALYSIS, ROUTINE W REFLEX MICROSCOPIC
Bacteria, UA: NONE SEEN
Bilirubin Urine: NEGATIVE
Glucose, UA: NEGATIVE mg/dL
Hgb urine dipstick: NEGATIVE
Ketones, ur: NEGATIVE mg/dL
Leukocytes,Ua: NEGATIVE
Nitrite: NEGATIVE
Protein, ur: 30 mg/dL — AB
Specific Gravity, Urine: 1.03 (ref 1.005–1.030)
pH: 6 (ref 5.0–8.0)

## 2018-09-24 LAB — BASIC METABOLIC PANEL
Anion gap: 10 (ref 5–15)
BUN: 27 mg/dL — ABNORMAL HIGH (ref 6–20)
CO2: 21 mmol/L — ABNORMAL LOW (ref 22–32)
Calcium: 8.9 mg/dL (ref 8.9–10.3)
Chloride: 115 mmol/L — ABNORMAL HIGH (ref 98–111)
Creatinine, Ser: 0.99 mg/dL (ref 0.61–1.24)
GFR calc Af Amer: >60 mL/min (ref >60)
GFR calc non Af Amer: >60 mL/min (ref >60)
Glucose, Bld: 120 mg/dL — ABNORMAL HIGH (ref 70–99)
Potassium: 3.5 mmol/L (ref 3.5–5.1)
Sodium: 146 mmol/L — ABNORMAL HIGH (ref 135–145)

## 2018-09-24 LAB — CBC
HCT: 41.7 % (ref 39.0–52.0)
Hemoglobin: 13.9 g/dL (ref 13.0–17.0)
MCH: 30.9 pg (ref 26.0–34.0)
MCHC: 33.3 g/dL (ref 30.0–36.0)
MCV: 92.7 fL (ref 80.0–100.0)
Platelets: 228 10*3/uL (ref 150–400)
RBC: 4.5 MIL/uL (ref 4.22–5.81)
RDW: 12.3 % (ref 11.5–15.5)
WBC: 10.8 10*3/uL — ABNORMAL HIGH (ref 4.0–10.5)
nRBC: 0 % (ref 0.0–0.2)

## 2018-09-24 LAB — GLUCOSE, CAPILLARY: Glucose-Capillary: 121 mg/dL — ABNORMAL HIGH (ref 70–99)

## 2018-09-24 LAB — RAPID URINE DRUG SCREEN, HOSP PERFORMED
Amphetamines: NOT DETECTED
Barbiturates: NOT DETECTED
Benzodiazepines: NOT DETECTED
Cocaine: NOT DETECTED
Opiates: NOT DETECTED
Tetrahydrocannabinol: NOT DETECTED

## 2018-09-24 MED ORDER — ENOXAPARIN SODIUM 40 MG/0.4ML ~~LOC~~ SOLN
40.0000 mg | Freq: Every day | SUBCUTANEOUS | Status: DC
  Administered 2018-09-24 – 2018-09-26 (×3): 40 mg via SUBCUTANEOUS
  Filled 2018-09-24 (×4): qty 0.4

## 2018-09-24 MED ORDER — DEXTROSE-NACL 5-0.45 % IV SOLN
INTRAVENOUS | Status: DC
  Administered 2018-09-24 – 2018-09-26 (×5): via INTRAVENOUS

## 2018-09-24 MED ORDER — CLONIDINE HCL 0.1 MG PO TABS: 0.1000 mg | ORAL_TABLET | ORAL | Status: DC

## 2018-09-24 MED ORDER — MAGNESIUM SULFATE 2 GM/50ML IV SOLN
2.0000 g | Freq: Once | INTRAVENOUS | Status: AC
  Administered 2018-09-24: 2 g via INTRAVENOUS
  Filled 2018-09-24: qty 50

## 2018-09-24 MED ORDER — CLONIDINE HCL 0.1 MG PO TABS
0.1000 mg | ORAL_TABLET | Freq: Four times a day (QID) | ORAL | Status: DC
  Administered 2018-09-24 – 2018-09-25 (×6): 0.1 mg via ORAL
  Filled 2018-09-24 (×6): qty 1

## 2018-09-24 MED ORDER — ACETAMINOPHEN 325 MG PO TABS
650.0000 mg | ORAL_TABLET | Freq: Four times a day (QID) | ORAL | Status: DC | PRN
  Administered 2018-09-27: 650 mg via ORAL
  Filled 2018-09-24: qty 2

## 2018-09-24 MED ORDER — ACETAMINOPHEN 650 MG RE SUPP: 650.0000 mg | Freq: Four times a day (QID) | RECTAL | Status: DC | PRN

## 2018-09-24 MED ORDER — METHOCARBAMOL 500 MG PO TABS: 500.0000 mg | ORAL_TABLET | Freq: Three times a day (TID) | ORAL | Status: DC | PRN

## 2018-09-24 MED ORDER — SERTRALINE HCL 20 MG/ML PO CONC
25.0000 mg | Freq: Every day | ORAL | Status: DC
  Administered 2018-09-24 – 2018-09-27 (×4): 25 mg via ORAL
  Filled 2018-09-24 (×4): qty 1.25

## 2018-09-24 MED ORDER — ONDANSETRON 4 MG PO TBDP: 4.0000 mg | ORAL_TABLET | Freq: Four times a day (QID) | ORAL | Status: DC | PRN

## 2018-09-24 MED ORDER — HYDROXYZINE HCL 25 MG PO TABS
25.0000 mg | ORAL_TABLET | Freq: Four times a day (QID) | ORAL | Status: DC | PRN
  Administered 2018-09-24 – 2018-09-27 (×4): 25 mg via ORAL
  Filled 2018-09-24 (×5): qty 1

## 2018-09-24 MED ORDER — CLONIDINE HCL 0.1 MG PO TABS: 0.1000 mg | ORAL_TABLET | Freq: Every day | ORAL | Status: DC

## 2018-09-24 MED ORDER — DICYCLOMINE HCL 20 MG PO TABS
20.0000 mg | ORAL_TABLET | Freq: Four times a day (QID) | ORAL | Status: DC | PRN
  Filled 2018-09-24: qty 1

## 2018-09-24 MED ORDER — ONDANSETRON HCL 4 MG PO TABS: 4.0000 mg | ORAL_TABLET | Freq: Four times a day (QID) | ORAL | Status: DC | PRN

## 2018-09-24 MED ORDER — LOPERAMIDE HCL 2 MG PO CAPS: 2.0000 mg | ORAL_CAPSULE | ORAL | Status: DC | PRN

## 2018-09-24 MED ORDER — NAPROXEN 500 MG PO TABS: 500.0000 mg | ORAL_TABLET | Freq: Two times a day (BID) | ORAL | Status: DC | PRN

## 2018-09-24 MED ORDER — ONDANSETRON HCL 4 MG/2ML IJ SOLN
4.0000 mg | Freq: Four times a day (QID) | INTRAMUSCULAR | Status: DC | PRN
  Administered 2018-09-24: 4 mg via INTRAVENOUS
  Filled 2018-09-24: qty 2

## 2018-09-24 NOTE — Consult Note (Signed)
Telepsych Consultation   Reason for Consult: ''depression, substance abuse, grieving the death of his sister and girlfriend both died after overdose in 07-31-2017.'' Referring Physician:  Dr. Albertine Grates Location of Patient: [facility] Location of Provider: [facility]  Patient Identification: Joshua Mcdowell MRN:  [id number] Principal Diagnosis: Acute metabolic encephalopathy Diagnosis:  Principal Problem:   Acute metabolic encephalopathy Active Problems:   Depression   Anxiety   AKI (acute kidney injury) (HCC)   Leukocytosis   Polysubstance abuse (HCC)   Tobacco abuse   Alcohol use   Opioid dependence with opioid-induced mood disorder (HCC)   Major depressive disorder, recurrent episode, moderate (HCC)   Total Time spent with patient: 45 minutes  Subjective:   Joshua Mcdowell is a 38 y.o. male patient admitted with altered mental status.  HPI: Patient who reports history of depression, anxiety and polysubstance abuse (tobacco, alcohol, cocaine, heroin) dating back to 6 years ago. He was admitted to the medical unit due to altered mental status. However, patient reports that his drug of choice is Heroin in the last 5-6 years denies other illicit drugs of abuse. He states that he was tired of using Heroin about 2 weeks ago and decided to self detox with Subuxone which he bought off the street. He states that his roommates brought him to the hospital yesterday because he was increasingly getting very lethargic, depressed and hopeless. He states that he was sleeping all the day all night for 2 weeks. Patient reports that he has decided to stop using drugs because his sister died from drug overdose 2018/01/31 and his girlfriend died from overdose in 07-01-2017. Patient reports fatigue, myalgia, nausea, anxiety but denies psychosis, delusions, SI/HI. He is requesting for detoxification and stabilization.  Past Psychiatric History: as above  Risk to Self:  denies Risk to Others:    denies Prior Inpatient Therapy:  yes Prior Outpatient Therapy:    None at the present Past Medical History:  Past Medical History:  Diagnosis Date  . Anxiety   . Depression     Past Surgical History:  Procedure Laterality Date  . ORIF ANKLE FRACTURE    . WISDOM TOOTH EXTRACTION     Family History: History reviewed. No pertinent family history. Family Psychiatric  History:  Social History:  Social History   Substance and Sexual Activity  Alcohol Use Yes     Social History   Substance and Sexual Activity  Drug Use Yes  . Types: Cocaine, Heroin   Comment: narcotics, heroine    Social History   Socioeconomic History  . Marital status: Single    Spouse name: Not on file  . Number of children: Not on file  . Years of education: Not on file  . Highest education level: Not on file  Occupational History  . Not on file  Social Needs  . Financial resource strain: Not on file  . Food insecurity    Worry: Not on file    Inability: Not on file  . Transportation needs    Medical: Not on file    Non-medical: Not on file  Tobacco Use  . Smoking status: Current Every Day Smoker  . Smokeless tobacco: Never Used  Substance and Sexual Activity  . Alcohol use: Yes  . Drug use: Yes    Types: Cocaine, Heroin    Comment: narcotics, heroine  . Sexual activity: Not on file  Lifestyle  . Physical activity    Days per week: Not on file  Minutes per session: Not on file  . Stress: Not on file  Relationships  . Social Herbalist on phone: Not on file    Gets together: Not on file    Attends religious service: Not on file    Active member of club or organization: Not on file    Attends meetings of clubs or organizations: Not on file    Relationship status: Not on file  Other Topics Concern  . Not on file  Social History Narrative  . Not on file   Additional Social History:    Allergies:  No Known Allergies  Labs:  Results for orders placed or performed  during the hospital encounter of 09/23/18 (from the past 48 hour(s))  CBC     Status: Abnormal   Collection Time: 09/23/18  5:46 PM  Result Value Ref Range   WBC 14.4 (H) 4.0 - 10.5 K/uL   RBC 5.04 4.22 - 5.81 MIL/uL   Hemoglobin 15.9 13.0 - 17.0 g/dL   HCT 45.3 39.0 - 52.0 %   MCV 89.9 80.0 - 100.0 fL   MCH 31.5 26.0 - 34.0 pg   MCHC 35.1 30.0 - 36.0 g/dL   RDW 12.7 11.5 - 15.5 %   Platelets 218 150 - 400 K/uL   nRBC 0.0 0.0 - 0.2 %    Comment: Performed at [facility], [place] [address], [place], [place] [address]  SARS Coronavirus 2 (CEPHEID - Performed in [place] hospital lab), Hosp Order     Status: None   Collection Time: 09/23/18  5:47 PM   Specimen: Nasopharyngeal Swab  Result Value Ref Range   SARS Coronavirus 2 NEGATIVE NEGATIVE    Comment: (NOTE) If result is NEGATIVE SARS-CoV-2 target nucleic acids are NOT DETECTED. The SARS-CoV-2 RNA is generally detectable in upper and lower  respiratory specimens during the acute phase of infection. The lowest  concentration of SARS-CoV-2 viral copies this assay can detect is 250  copies / mL. A negative result does not preclude SARS-CoV-2 infection  and should not be used as the sole basis for treatment or other  patient management decisions.  A negative result may occur with  improper specimen collection / handling, submission of specimen other  than nasopharyngeal swab, presence of viral mutation(s) within the  areas targeted by this assay, and inadequate number of viral copies  (<250 copies / mL). A negative result must be combined with clinical  observations, patient history, and epidemiological information. If result is POSITIVE SARS-CoV-2 target nucleic acids are DETECTED. The SARS-CoV-2 RNA is generally detectable in upper and lower  respiratory specimens dur ing the acute phase of infection.  Positive  results are indicative of active infection with SARS-CoV-2.  Clinical  correlation with patient  history and other diagnostic information is  necessary to determine patient infection status.  Positive results do  not rule out bacterial infection or co-infection with other viruses. If result is PRESUMPTIVE POSTIVE SARS-CoV-2 nucleic acids MAY BE PRESENT.   A presumptive positive result was obtained on the submitted specimen  and confirmed on repeat testing.  While 2019 novel coronavirus  (SARS-CoV-2) nucleic acids may be present in the submitted sample  additional confirmatory testing may be necessary for epidemiological  and / or clinical management purposes  to differentiate between  SARS-CoV-2 and other Sarbecovirus currently known to infect humans.  If clinically indicated additional testing with an alternate test  methodology [phone]) is advised. The SARS-CoV-2 RNA is  generally  detectable in upper and lower respiratory sp ecimens during the acute  phase of infection. The expected result is Negative. Fact Sheet for Patients:  BoilerBrush.com.cyhttps://www.fda.gov/media/136312/download Fact Sheet for Healthcare Providers: https://pope.com/https://www.fda.gov/media/136313/download This test is not yet approved or cleared by the [place] FDA and has been authorized for detection and/or diagnosis of SARS-CoV-2 by FDA under an Emergency Use Authorization (EUA).  This EUA will remain in effect (meaning this test can be used) for the duration of the COVID-19 declaration under Section 564(b)(1) of the Act, 21 U.S.C. section 360bbb-3(b)(1), unless the authorization is terminated or revoked sooner. Performed at [facility], [address] [place], [place] [address]   Acetaminophen level     Status: Abnormal   Collection Time: 09/23/18  6:30 PM  Result Value Ref Range   Acetaminophen (Tylenol), Serum <10 (L) 10 - 30 ug/mL    Comment: (NOTE) Therapeutic concentrations vary significantly. A range of 10-30 ug/mL  may be an effective concentration for many patients. However, some  are best  treated at concentrations outside of this range. Acetaminophen concentrations >150 ug/mL at 4 hours after ingestion  and >50 ug/mL at 12 hours after ingestion are often associated with  toxic reactions. Performed at [facility], [address] [place], [place] [address]   Ethanol     Status: None   Collection Time: 09/23/18  6:30 PM  Result Value Ref Range   Alcohol, Ethyl (B) <10 <10 mg/dL    Comment: (NOTE) Lowest detectable limit for serum alcohol is 10 mg/dL. For medical purposes only. Performed at [facility], [address] [place], [place] [address]   Salicylate level     Status: None   Collection Time: 09/23/18  6:30 PM  Result Value Ref Range   Salicylate Lvl <7.0 2.8 - 30.0 mg/dL    Comment: Performed at [facility], [address] [place], [place] [address]  CK     Status: None   Collection Time: 09/23/18  6:30 PM  Result Value Ref Range   Total CK 328 49 - 397 U/L    Comment: Performed at [facility], [address] [place], [place] [address]  Comprehensive metabolic panel     Status: Abnormal   Collection Time: 09/23/18  6:30 PM  Result Value Ref Range   Sodium 146 (H) 135 - 145 mmol/L   Potassium 4.2 3.5 - 5.1 mmol/L   Chloride 109 98 - 111 mmol/L   CO2 23 22 - 32 mmol/L   Glucose, Bld 130 (H) 70 - 99 mg/dL   BUN 34 (H) 6 - 20 mg/dL   Creatinine, Ser 5.781.34 (H) 0.61 - 1.24 mg/dL   Calcium 9.4 8.9 - 46.910.3 mg/dL   Total Protein 8.5 (H) 6.5 - 8.1 g/dL   Albumin 5.2 (H) 3.5 - 5.0 g/dL   AST 20 15 - 41 U/L   ALT 23 0 - 44 U/L   Alkaline Phosphatase 60 38 - 126 U/L   Total Bilirubin 0.8 0.3 - 1.2 mg/dL   GFR calc non Af Amer >60 >60 mL/min   GFR calc Af Amer >60 >60 mL/min   Anion gap 14 5 - 15    Comment: Performed at [facility], [address] [place], [place] [address]  Urinalysis, Routine w reflex microscopic     Status: Abnormal   Collection Time:  09/23/18 11:51 PM  Result Value Ref Range   Color, Urine  YELLOW YELLOW   APPearance CLEAR CLEAR   Specific Gravity, Urine 1.030 1.005 - 1.030   pH 6.0 5.0 - 8.0   Glucose, UA NEGATIVE NEGATIVE mg/dL   Hgb urine dipstick NEGATIVE NEGATIVE   Bilirubin Urine NEGATIVE NEGATIVE   Ketones, ur NEGATIVE NEGATIVE mg/dL   Protein, ur 30 (A) NEGATIVE mg/dL   Nitrite NEGATIVE NEGATIVE   Leukocytes,Ua NEGATIVE NEGATIVE   RBC / HPF 0-5 0 - 5 RBC/hpf   WBC, UA 0-5 0 - 5 WBC/hpf   Bacteria, UA NONE SEEN NONE SEEN   Squamous Epithelial / LPF 0-5 0 - 5   Mucus PRESENT     Comment: Performed at [facility], [address] [place], [place] [address]  Rapid urine drug screen (hospital performed)     Status: None   Collection Time: 09/23/18 11:51 PM  Result Value Ref Range   Opiates NONE DETECTED NONE DETECTED   Cocaine NONE DETECTED NONE DETECTED   Benzodiazepines NONE DETECTED NONE DETECTED   Amphetamines NONE DETECTED NONE DETECTED   Tetrahydrocannabinol NONE DETECTED NONE DETECTED   Barbiturates NONE DETECTED NONE DETECTED    Comment: (NOTE) DRUG SCREEN FOR MEDICAL PURPOSES ONLY.  IF CONFIRMATION IS NEEDED FOR ANY PURPOSE, NOTIFY LAB WITHIN 5 DAYS. LOWEST DETECTABLE LIMITS FOR URINE DRUG SCREEN Drug Class                     Cutoff (ng/mL) Amphetamine and metabolites    1000 Barbiturate and metabolites    200 Benzodiazepine                 200 Tricyclics and metabolites     300 Opiates and metabolites        300 Cocaine and metabolites        300 THC                            50 Performed at [facility], [address] [place], [place] [address]   Basic metabolic panel     Status: Abnormal   Collection Time: 09/24/18  6:56 AM  Result Value Ref Range   Sodium 146 (H) 135 - 145 mmol/L   Potassium 3.5 3.5 - 5.1 mmol/L   Chloride 115 (H) 98 - 111 mmol/L   CO2 21 (L) 22 - 32 mmol/L   Glucose, Bld 120 (H) 70 - 99 mg/dL   BUN 27 (H) 6 - 20 mg/dL    Creatinine, Ser 8.290.99 0.61 - 1.24 mg/dL   Calcium 8.9 8.9 - 56.210.3 mg/dL   GFR calc non Af Amer >60 >60 mL/min   GFR calc Af Amer >60 >60 mL/min   Anion gap 10 5 - 15    Comment: Performed at [facility], [address] [place], [place] [address]  CBC     Status: Abnormal   Collection Time: 09/24/18  6:56 AM  Result Value Ref Range   WBC 10.8 (H) 4.0 - 10.5 K/uL   RBC 4.50 4.22 - 5.81 MIL/uL   Hemoglobin 13.9 13.0 - 17.0 g/dL   HCT 57.841.7 46.939.0 - 62.952.0 %   MCV 92.7 80.0 - 100.0 fL   MCH 30.9 26.0 - 34.0 pg   MCHC 33.3 30.0 - 36.0 g/dL   RDW 52.812.3 41.311.5 - 24.415.5 %   Platelets 228 150 - 400 K/uL   nRBC 0.0 0.0 - 0.2 %    Comment: Performed  at [facility], [address] [place], [place] [address]  Glucose, capillary     Status: Abnormal   Collection Time: 09/24/18  7:40 AM  Result Value Ref Range   Glucose-Capillary 121 (H) 70 - 99 mg/dL    Medications:  Current Facility-Administered Medications  Medication Dose Route Frequency Provider Last Rate Last Dose  . acetaminophen (TYLENOL) tablet 650 mg  650 mg Oral Q6H PRN Lorretta HarpNiu, Xilin, MD       Or  . acetaminophen (TYLENOL) suppository 650 mg  650 mg Rectal Q6H PRN Lorretta HarpNiu, Xilin, MD      . cloNIDine (CATAPRES) tablet 0.1 mg  0.1 mg Oral QID Thedore MinsAkintayo, Kuper Rennels, MD       Followed by  . [START ON 09/26/2018] cloNIDine (CATAPRES) tablet 0.1 mg  0.1 mg Oral BH-qamhs Jelina Paulsen, MD       Followed by  . [START ON 09/29/2018] cloNIDine (CATAPRES) tablet 0.1 mg  0.1 mg Oral QAC breakfast Xayvier Vallez, MD      . dextrose 5 %-0.45 % sodium chloride infusion   Intravenous Continuous Lorretta HarpNiu, Xilin, MD 125 mL/hr at 09/24/18 0600    . dicyclomine (BENTYL) tablet 20 mg  20 mg Oral Q6H PRN Fumiko Cham, MD      . enoxaparin (LOVENOX) injection 40 mg  40 mg Subcutaneous Daily Lorretta HarpNiu, Xilin, MD   40 mg at 09/24/18 1020  . folic acid (FOLVITE) tablet 1 mg  1 mg Oral Daily Lorretta HarpNiu, Xilin, MD   1 mg at 09/24/18 1019  .  hydrOXYzine (ATARAX/VISTARIL) tablet 25 mg  25 mg Oral Q6H PRN Jalen Oberry, MD      . loperamide (IMODIUM) capsule 2-4 mg  2-4 mg Oral PRN Lyan Moyano, MD      . LORazepam (ATIVAN) tablet 1 mg  1 mg Oral Q6H PRN Lorretta HarpNiu, Xilin, MD       Or  . LORazepam (ATIVAN) injection 1 mg  1 mg Intravenous Q6H PRN Lorretta HarpNiu, Xilin, MD      . magnesium sulfate IVPB 2 g 50 mL  2 g Intravenous Once Albertine GratesXu, Fang, MD      . methocarbamol (ROBAXIN) tablet 500 mg  500 mg Oral Q8H PRN Jill Stopka, MD      . multivitamin with minerals tablet 1 tablet  1 tablet Oral Daily Lorretta HarpNiu, Xilin, MD   1 tablet at 09/24/18 1019  . ondansetron (ZOFRAN) tablet 4 mg  4 mg Oral Q6H PRN Lorretta HarpNiu, Xilin, MD       Or  . ondansetron [facility](ZOFRAN) injection 4 mg  4 mg Intravenous Q6H PRN Lorretta HarpNiu, Xilin, MD   4 mg at 09/24/18 1035  . sertraline (ZOLOFT) 20 MG/ML concentrated solution 25 mg  25 mg Oral Daily Lorretta HarpNiu, Xilin, MD   25 mg at 09/24/18 1019  . thiamine (VITAMIN B-1) tablet 100 mg  100 mg Oral Daily Lorretta HarpNiu, Xilin, MD   100 mg at 09/24/18 1019   Or  . thiamine (B-1) injection 100 mg  100 mg Intravenous Daily Lorretta HarpNiu, Xilin, MD        Musculoskeletal: Strength & Muscle Tone: not tested Gait & Station: not tested Patient leans: N/A  Psychiatric Specialty Exam: Physical Exam  Psychiatric: Thought content normal. His speech is delayed. He is slowed and withdrawn. Cognition and memory are normal. He expresses impulsivity. He exhibits a depressed mood.    Review of Systems  Constitutional: Positive for malaise/fatigue.  HENT: Negative.   Eyes: Negative.   Respiratory: Negative.  Gastrointestinal: Positive for nausea.  Genitourinary: Negative.   Musculoskeletal: Positive for myalgias.  Skin: Negative.   Neurological: Positive for tremors.  Endo/Heme/Allergies: Negative.   Psychiatric/Behavioral: Positive for depression and substance abuse. The patient has insomnia.     Blood pressure (!) 141/93, pulse 88, temperature 99 F (37.2 C), temperature  source Oral, resp. rate (!) 25, height  (1.778 m), weight 72.4 kg, SpO2 99 %.Body mass index is 22.9 kg/m.  General Appearance: Fairly Groomed  Eye Contact:  Minimal  Speech:  Slow  Volume:  Decreased  Mood:  Depressed and Dysphoric  Affect:  Constricted  Thought Process:  Coherent and Linear  Orientation:  Full (Time, Place, and Person)  Thought Content:  Logical  Suicidal Thoughts:  No  Homicidal Thoughts:  No  Memory:  Immediate;   Fair Recent;   Fair Remote;   Fair  Judgement:  Poor  Insight:  Shallow  Psychomotor Activity:  Psychomotor Retardation  Concentration:  Concentration: Fair and Attention Span: Fair  Recall:  [employer] of Knowledge:  Fair  Language:  Good  Akathisia:  No  Handed:  Right  AIMS (if indicated):     Assets:  Communication Skills Desire for Improvement  ADL's:  Intact  Cognition:  WNL  Sleep:   poor     Treatment Plan Summary:  38 y.o. male with medical history significant of polysubstance abuse (tobacco, alcohol, cocaine, heroin), depression, anxiety, who was admitted to the medical unit due to altered mental status. Currently, patient reports low energy level, depression, lack of motivation and hopelessness. He is trying to stop using Heroin after his girl fried and sister died in Aug 04, 2017 by overdoing on Heroin. He will benefit from inpatient psychiatric admission for stabilization after he is medically cleared.  Diagnosis: 1. Major depressive disorder-recurrent episode moderate 2. Opioid dependence with opioid-induced mood disorder  Recommendations:  -Consider D/C Lorazepam CIWA Protocol, patient drug of choice is Heroin -Consider starting patient on Clonidine Opioid detox protocol -Continue Sertraline 25 mg daily for depression -Consider Social worker consult to facilitate placement in psychiatric hospital -Psychiatric service signing out. Re-consult as needed.   Disposition: Recommend psychiatric Inpatient admission when medically  cleared. Supportive therapy provided about ongoing stressors.  This service was provided via telemedicine using a 2-way, interactive audio and video technology.  Names of all persons participating in this telemedicine service and their role in this encounter. Name: Nanda Quinton, MD  Role: Psychiatrist  Name: Arlyss Gandy Role: RN  Name: Randall Hiss Role: patient   Thedore Mins, MD 09/24/2018 1:03 PM

## 2018-09-24 NOTE — Progress Notes (Signed)
PROGRESS NOTE  Joshua Mcdowell ZOX:[id number] DOB: 05/14/1980 DOA: 09/23/2018 PCP: Patient, No Pcp Per  HPI/Recap of past 24 hours:  Still confused with intermittent hallucination Still + tremors Vomited this am after taking meds, denies pain, no fever  Assessment/Plan: Principal Problem:   Acute metabolic encephalopathy Active Problems:   Depression   Anxiety   AKI (acute kidney injury) (HCC)   Leukocytosis   Polysubstance abuse (HCC)   Tobacco abuse   Alcohol use  Acute toxic encephalopathy -Per ER intake "roommate called EMS reports pt has not moved from bed in 2 days. Pt has been incontinent for 2 days at home in bed. Pt denies taking medications. EMS reports pt has been twitching and pt did not want to go with EMS at first. EMS reports a bowl of white powder substance by bed. " he is found covered in fecal matter upon arrival -today he reports drinking alcohol and took xanex  -CT head is negative for acute intracranial abnormalities -no fever, denies neck pain, ua and cxr negative -uds negative -get EEG -continue hydration  Hypernatremia: -sodium 146 -continue d5half saline  AKI -likely from dehydration, urine is concentrated -bun/cr 34/1.34 on presentation -improving on hydration, today bun/cr 27/0.99, continue hydration  Leukocytosis: Wbc 14.4, improving on ivf No fever, blood culture in process, denies neck pain, ua and cxr negative   Polysubstance abuse, anxiety/depression -he reports feeling depressed after girlfriend and sister died from drug overdose -psychiatry consulted who recommend inpatient psych placement once medically cleared    Code Status: full  Family Communication: patient   Disposition Plan: discharge to inpatient psych facility once medically stable   Consultants:  psychiatry  Procedures:  none  Antibiotics:  none   Objective: BP (!) 141/93 (BP Location: Right Arm) Comment: rn aware  Pulse 88   Temp 99 F (37.2 C)  (Oral)   Resp (!) 25 Comment: rn aware  Ht 5\' 10"  (1.778 m)   Wt 72.4 kg   SpO2 99%   BMI 22.90 kg/m   Intake/Output Summary (Last 24 hours) at 09/24/2018 1110 Last data filed at 09/24/2018 0957 Gross per 24 hour  Intake 876.66 ml  Output 625 ml  Net 251.66 ml   Filed Weights   09/24/18 0143  Weight: 72.4 kg    Exam: Patient is examined daily including today on 09/24/2018, exams remain the same as of yesterday except that has changed    General:  Confused, +tremor  Cardiovascular: RRR  Respiratory: CTABL  Abdomen: Soft/ND/NT, positive BS  Musculoskeletal: No Edema  Neuro: confused  Data Reviewed: Basic Metabolic Panel: Recent Labs  Lab 09/23/18 1830 09/24/18 0656  NA 146* 146*  K 4.2 3.5  CL 109 115*  CO2 23 21*  GLUCOSE 130* 120*  BUN 34* 27*  CREATININE 1.34* 0.99  CALCIUM 9.4 8.9   Liver Function Tests: Recent Labs  Lab 09/23/18 1830  AST 20  ALT 23  ALKPHOS 60  BILITOT 0.8  PROT 8.5*  ALBUMIN 5.2*   No results for input(s): LIPASE, AMYLASE in the last 168 hours. No results for input(s): AMMONIA in the last 168 hours. CBC: Recent Labs  Lab 09/23/18 1746 09/24/18 0656  WBC 14.4* 10.8*  HGB 15.9 13.9  HCT 45.3 41.7  MCV 89.9 92.7  PLT 218 228   Cardiac Enzymes:   Recent Labs  Lab 09/23/18 1830  CKTOTAL 328   BNP (last 3 results) No results for input(s): BNP in the last 8760 hours.  ProBNP (last  3 results) No results for input(s): PROBNP in the last 8760 hours.  CBG: Recent Labs  Lab 09/24/18 0740  GLUCAP 121*    Recent Results (from the past 240 hour(s))  SARS Coronavirus 2 (CEPHEID - Performed in [facility] hospital lab), Hosp Order     Status: None   Collection Time: 09/23/18  5:47 PM   Specimen: Nasopharyngeal Swab  Result Value Ref Range Status   SARS Coronavirus 2 NEGATIVE NEGATIVE Final    Comment: (NOTE) If result is NEGATIVE SARS-CoV-2 target nucleic acids are NOT DETECTED. The SARS-CoV-2 RNA is generally  detectable in upper and lower  respiratory specimens during the acute phase of infection. The lowest  concentration of SARS-CoV-2 viral copies this assay can detect is 250  copies / mL. A negative result does not preclude SARS-CoV-2 infection  and should not be used as the sole basis for treatment or other  patient management decisions.  A negative result may occur with  improper specimen collection / handling, submission of specimen other  than nasopharyngeal swab, presence of viral mutation(s) within the  areas targeted by this assay, and inadequate number of viral copies  (<250 copies / mL). A negative result must be combined with clinical  observations, patient history, and epidemiological information. If result is POSITIVE SARS-CoV-2 target nucleic acids are DETECTED. The SARS-CoV-2 RNA is generally detectable in upper and lower  respiratory specimens dur ing the acute phase of infection.  Positive  results are indicative of active infection with SARS-CoV-2.  Clinical  correlation with patient history and other diagnostic information is  necessary to determine patient infection status.  Positive results do  not rule out bacterial infection or co-infection with other viruses. If result is PRESUMPTIVE POSTIVE SARS-CoV-2 nucleic acids MAY BE PRESENT.   A presumptive positive result was obtained on the submitted specimen  and confirmed on repeat testing.  While 2019 novel coronavirus  (SARS-CoV-2) nucleic acids may be present in the submitted sample  additional confirmatory testing may be necessary for epidemiological  and / or clinical management purposes  to differentiate between  SARS-CoV-2 and other Sarbecovirus currently known to infect humans.  If clinically indicated additional testing with an alternate test  methodology [phone] is advised. The SARS-CoV-2 RNA is generally  detectable in upper and lower respiratory sp ecimens during the acute  phase of infection. The  expected result is Negative. Fact Sheet for Patients:  BoilerBrush.com.cyhttps://www.fda.gov/media/136312/download Fact Sheet for Healthcare Providers: https://pope.com/https://www.fda.gov/media/136313/download This test is not yet approved or cleared by the [place] FDA and has been authorized for detection and/or diagnosis of SARS-CoV-2 by FDA under an Emergency Use Authorization (EUA).  This EUA will remain in effect (meaning this test can be used) for the duration of the COVID-19 declaration under Section 564(b)(1) of the Act, 21 U.S.C. section 360bbb-3(b)(1), unless the authorization is terminated or revoked sooner. Performed at [facility], [address] [place], [place] [address]      Studies: Ct Head Wo Contrast  Result Date: 09/23/2018 CLINICAL DATA:  Incontinence.  Altered consciousness. EXAM: CT HEAD WITHOUT CONTRAST TECHNIQUE: Contiguous axial images were obtained from the base of the skull through the vertex without intravenous contrast. COMPARISON:  January 16, 2012 FINDINGS: Brain: No evidence of acute infarction, hemorrhage, hydrocephalus, extra-axial collection or mass lesion/mass effect. Vascular: No hyperdense vessel or unexpected calcification. Skull: Normal. Negative for fracture or focal lesion. Sinuses/Orbits: No acute finding. Other: None. IMPRESSION: No acute intracranial abnormalities identified. Electronically Signed  By: Dorise Bullion III M.D   On: 09/23/2018 18:11   Dg Chest Port 1 View  Result Date: 09/23/2018 CLINICAL DATA:  Altered mental status. EXAM: PORTABLE CHEST 1 VIEW COMPARISON:  None. FINDINGS: The heart size and mediastinal contours are within normal limits. Both lungs are clear. The visualized skeletal structures are unremarkable. IMPRESSION: No active disease. Electronically Signed   By: Constance Holster M.D.   On: 09/23/2018 18:12    Scheduled Meds: . enoxaparin (LOVENOX) injection  40 mg Subcutaneous Daily  . folic acid  1 mg Oral Daily  .  LORazepam  0-4 mg Intravenous Q6H   Followed by  . [START ON 09/26/2018] LORazepam  0-4 mg Intravenous Q12H  . multivitamin with minerals  1 tablet Oral Daily  . sertraline  25 mg Oral Daily  . thiamine  100 mg Oral Daily   Or  . thiamine  100 mg Intravenous Daily    Continuous Infusions: . dextrose 5 % and 0.45% NaCl 125 mL/hr at 09/24/18 0600  . magnesium sulfate bolus IVPB       Time spent: 54mins I have personally reviewed and interpreted on  09/24/2018 daily labs, tele strips, imagings as discussed above under date review session and assessment and plans.  I reviewed all nursing notes, pharmacy notes, consultant notes,  vitals, pertinent old records  I have discussed plan of care as described above with RN , patient  on 09/24/2018   Florencia Reasons MD, PhD  [facility] Pager [phone]. If 7PM-7AM, please contact night-coverage at [email or web address], password [facility] 09/24/2018, 11:10 AM  LOS: 0 days

## 2018-09-25 DIAGNOSIS — E870 Hyperosmolality and hypernatremia: Secondary | ICD-10-CM

## 2018-09-25 DIAGNOSIS — E876 Hypokalemia: Secondary | ICD-10-CM

## 2018-09-25 LAB — CBC WITH DIFFERENTIAL/PLATELET
Abs Immature Granulocytes: 0.06 10*3/uL (ref 0.00–0.07)
Basophils Absolute: 0 10*3/uL (ref 0.0–0.1)
Basophils Relative: 0 %
Eosinophils Absolute: 0 10*3/uL (ref 0.0–0.5)
Eosinophils Relative: 1 %
HCT: 37.8 % — ABNORMAL LOW (ref 39.0–52.0)
Hemoglobin: 12.8 g/dL — ABNORMAL LOW (ref 13.0–17.0)
Immature Granulocytes: 1 %
Lymphocytes Relative: 21 %
Lymphs Abs: 1.5 10*3/uL (ref 0.7–4.0)
MCH: 30.9 pg (ref 26.0–34.0)
MCHC: 33.9 g/dL (ref 30.0–36.0)
MCV: 91.3 fL (ref 80.0–100.0)
Monocytes Absolute: 0.6 10*3/uL (ref 0.1–1.0)
Monocytes Relative: 9 %
Neutro Abs: 4.8 10*3/uL (ref 1.7–7.7)
Neutrophils Relative %: 68 %
Platelets: 186 10*3/uL (ref 150–400)
RBC: 4.14 MIL/uL — ABNORMAL LOW (ref 4.22–5.81)
RDW: 11.9 % (ref 11.5–15.5)
WBC: 7 10*3/uL (ref 4.0–10.5)
nRBC: 0 % (ref 0.0–0.2)

## 2018-09-25 LAB — BASIC METABOLIC PANEL
Anion gap: 9 (ref 5–15)
BUN: 16 mg/dL (ref 6–20)
CO2: 23 mmol/L (ref 22–32)
Calcium: 8.3 mg/dL — ABNORMAL LOW (ref 8.9–10.3)
Chloride: 110 mmol/L (ref 98–111)
Creatinine, Ser: 1.03 mg/dL (ref 0.61–1.24)
GFR calc Af Amer: >60 mL/min (ref >60)
GFR calc non Af Amer: >60 mL/min (ref >60)
Glucose, Bld: 77 mg/dL (ref 70–99)
Potassium: 3 mmol/L — ABNORMAL LOW (ref 3.5–5.1)
Sodium: 142 mmol/L (ref 135–145)

## 2018-09-25 LAB — MAGNESIUM: Magnesium: 2.4 mg/dL (ref 1.7–2.4)

## 2018-09-25 LAB — TSH: TSH: 0.49 u[IU]/mL (ref 0.350–4.500)

## 2018-09-25 LAB — GLUCOSE, CAPILLARY: Glucose-Capillary: 103 mg/dL — ABNORMAL HIGH (ref 70–99)

## 2018-09-25 LAB — HIV ANTIBODY (ROUTINE TESTING W REFLEX): HIV Screen 4th Generation wRfx: NONREACTIVE

## 2018-09-25 MED ORDER — POTASSIUM CHLORIDE CRYS ER 20 MEQ PO TBCR
40.0000 meq | EXTENDED_RELEASE_TABLET | ORAL | Status: AC
  Administered 2018-09-25 (×2): 40 meq via ORAL
  Filled 2018-09-25 (×2): qty 2

## 2018-09-25 MED ORDER — CLONIDINE HCL 0.1 MG PO TABS: 0.1000 mg | ORAL_TABLET | Freq: Every day | ORAL | Status: DC

## 2018-09-25 MED ORDER — CLONIDINE HCL 0.1 MG PO TABS
0.1000 mg | ORAL_TABLET | ORAL | Status: AC
  Administered 2018-09-26 – 2018-09-27 (×3): 0.1 mg via ORAL
  Filled 2018-09-25 (×3): qty 1

## 2018-09-25 NOTE — Plan of Care (Signed)

## 2018-09-25 NOTE — Progress Notes (Signed)
Texted [facility] again after Telemetry called stating patient's HR was as low as 38.  Stated that they captured a strip.  Currently at 53.

## 2018-09-25 NOTE — Progress Notes (Signed)
Texted [facility] to report pulse below 55.  Currently at 34.  No new orders given as of 2030.  HR remains at 45.

## 2018-09-25 NOTE — Progress Notes (Signed)
Patient with asymptomatic bradycardia overnight.  Will hold next clonidine dose, review in AM.

## 2018-09-25 NOTE — Progress Notes (Signed)
PROGRESS NOTE  Joshua Mcdowell:[id number] DOB: 08/09/1980 DOA: 09/23/2018 PCP: Patient, No Pcp Per  HPI/Recap of past 24 hours:  Appear more lucid, less tremors He thinks he is having diarrhea from heroin withdrawal ,  he desires to go to behavioral health once medically improved  denies pain, no fever  Assessment/Plan: Principal Problem:   Acute metabolic encephalopathy Active Problems:   Depression   Anxiety   AKI (acute kidney injury) (HCC)   Leukocytosis   Polysubstance abuse (HCC)   Tobacco abuse   Alcohol use   Opioid dependence with opioid-induced mood disorder (HCC)   Major depressive disorder, recurrent episode, moderate (HCC)  Acute toxic encephalopathy -Per ER intake "roommate called EMS reports pt has not moved from bed in 2 days. Pt has been incontinent for 2 days at home in bed. Pt denies taking medications. EMS reports pt has been twitching and pt did not want to go with EMS at first. EMS reports a bowl of white powder substance by bed. " he is found covered in fecal matter upon arrival -CT head is negative for acute intracranial abnormalities -no fever, denies neck pain, ua and cxr negative -uds negative -EEG pending -continue hydration, appear improving  Hypernatremia: -sodium 146 -normalized with  d5half saline, continue hydration till he is able to eat more  Hypokalemia: Replace k, check mag  AKI -likely from dehydration, urine is concentrated -bun/cr 34/1.34 on presentation -bun/cr normalized on hydration, continue hydration for another 24hrs  Leukocytosis: Wbc 14.4, normalized with  ivf No fever, blood culture in process, denies neck pain, ua and cxr negative   Polysubstance abuse, anxiety/depression -he reports feeling depressed after girlfriend and sister died from drug overdose -psychiatry consulted who recommend start clonidine detox protocol and  inpatient psych placement once medically cleared    Code Status: full  Family  Communication: patient   Disposition Plan: discharge to inpatient psych facility once medically stable   Consultants:  psychiatry  Procedures:  none  Antibiotics:  none   Objective: BP 121/83 (BP Location: Left Arm)   Pulse (!) 50   Temp 99 F (37.2 C) (Oral)   Resp 16   Ht 5\' 10"  (1.778 m)   Wt 72.4 kg   SpO2 98%   BMI 22.90 kg/m   Intake/Output Summary (Last 24 hours) at 09/25/2018 1527 Last data filed at 09/25/2018 1504 Gross per 24 hour  Intake 4000.02 ml  Output 452 ml  Net 3548.02 ml   Filed Weights   09/24/18 0143  Weight: 72.4 kg    Exam: Patient is examined daily including today on 09/25/2018, exams remain the same as of yesterday except that has changed    General:  More lucid, less tremor  Cardiovascular: RRR  Respiratory: CTABL  Abdomen: Soft/ND/NT, positive BS  Musculoskeletal: No Edema  Neuro: More lucid, less tremor  Data Reviewed: Basic Metabolic Panel: Recent Labs  Lab 09/23/18 1830 09/24/18 0656 09/25/18 0547  NA 146* 146* 142  K 4.2 3.5 3.0*  CL 109 115* 110  CO2 23 21* 23  GLUCOSE 130* 120* 77  BUN 34* 27* 16  CREATININE 1.34* 0.99 1.03  CALCIUM 9.4 8.9 8.3*  MG  --   --  2.4   Liver Function Tests: Recent Labs  Lab 09/23/18 1830  AST 20  ALT 23  ALKPHOS 60  BILITOT 0.8  PROT 8.5*  ALBUMIN 5.2*   No results for input(s): LIPASE, AMYLASE in the last 168 hours. No results for input(s): AMMONIA  in the last 168 hours. CBC: Recent Labs  Lab 09/23/18 1746 09/24/18 0656 09/25/18 0547  WBC 14.4* 10.8* 7.0  NEUTROABS  --   --  4.8  HGB 15.9 13.9 12.8*  HCT 45.3 41.7 37.8*  MCV 89.9 92.7 91.3  PLT 218 228 186   Cardiac Enzymes:   Recent Labs  Lab 09/23/18 1830  CKTOTAL 328   BNP (last 3 results) No results for input(s): BNP in the last 8760 hours.  ProBNP (last 3 results) No results for input(s): PROBNP in the last 8760 hours.  CBG: Recent Labs  Lab 09/24/18 0740 09/25/18 0725  GLUCAP 121*  103*    Recent Results (from the past 240 hour(s))  SARS Coronavirus 2 (CEPHEID - Performed in [facility] hospital lab), Hosp Order     Status: None   Collection Time: 09/23/18  5:47 PM   Specimen: Nasopharyngeal Swab  Result Value Ref Range Status   SARS Coronavirus 2 NEGATIVE NEGATIVE Final    Comment: (NOTE) If result is NEGATIVE SARS-CoV-2 target nucleic acids are NOT DETECTED. The SARS-CoV-2 RNA is generally detectable in upper and lower  respiratory specimens during the acute phase of infection. The lowest  concentration of SARS-CoV-2 viral copies this assay can detect is 250  copies / mL. A negative result does not preclude SARS-CoV-2 infection  and should not be used as the sole basis for treatment or other  patient management decisions.  A negative result may occur with  improper specimen collection / handling, submission of specimen other  than nasopharyngeal swab, presence of viral mutation(s) within the  areas targeted by this assay, and inadequate number of viral copies  (<250 copies / mL). A negative result must be combined with clinical  observations, patient history, and epidemiological information. If result is POSITIVE SARS-CoV-2 target nucleic acids are DETECTED. The SARS-CoV-2 RNA is generally detectable in upper and lower  respiratory specimens dur ing the acute phase of infection.  Positive  results are indicative of active infection with SARS-CoV-2.  Clinical  correlation with patient history and other diagnostic information is  necessary to determine patient infection status.  Positive results do  not rule out bacterial infection or co-infection with other viruses. If result is PRESUMPTIVE POSTIVE SARS-CoV-2 nucleic acids MAY BE PRESENT.   A presumptive positive result was obtained on the submitted specimen  and confirmed on repeat testing.  While 2019 novel coronavirus  (SARS-CoV-2) nucleic acids may be present in the submitted sample  additional  confirmatory testing may be necessary for epidemiological  and / or clinical management purposes  to differentiate between  SARS-CoV-2 and other Sarbecovirus currently known to infect humans.  If clinically indicated additional testing with an alternate test  methodology [phone]([id number]) is advised. The SARS-CoV-2 RNA is generally  detectable in upper and lower respiratory sp ecimens during the acute  phase of infection. The expected result is Negative. Fact Sheet for Patients:  BoilerBrush.com.cyhttps://www.fda.gov/media/136312/download Fact Sheet for Healthcare Providers: https://pope.com/https://www.fda.gov/media/136313/download This test is not yet approved or cleared by the [place] FDA and has been authorized for detection and/or diagnosis of SARS-CoV-2 by FDA under an Emergency Use Authorization (EUA).  This EUA will remain in effect (meaning this test can be used) for the duration of the COVID-19 declaration under Section 564(b)(1) of the Act, 21 U.S.C. section 360bbb-3(b)(1), unless the authorization is terminated or revoked sooner. Performed at [facility], [address] [place], [place] [address]      Studies: No results  found.  Scheduled Meds: . cloNIDine  0.1 mg Oral QID   Followed by  . [START ON 09/26/2018] cloNIDine  0.1 mg Oral BH-qamhs   Followed by  . [START ON 09/29/2018] cloNIDine  0.1 mg Oral QAC breakfast  . enoxaparin (LOVENOX) injection  40 mg Subcutaneous Daily  . folic acid  1 mg Oral Daily  . multivitamin with minerals  1 tablet Oral Daily  . potassium chloride  40 mEq Oral Q4H  . sertraline  25 mg Oral Daily  . thiamine  100 mg Oral Daily   Or  . thiamine  100 mg Intravenous Daily    Continuous Infusions: . dextrose 5 % and 0.45% NaCl 125 mL/hr at 09/25/18 1112     Time spent: 45mins I have personally reviewed and interpreted on  09/25/2018 daily labs, tele strips, imagings as discussed above under date review session and assessment and plans.  I  reviewed all nursing notes, pharmacy notes, consultant notes,  vitals, pertinent old records  I have discussed plan of care as described above with RN , patient  on 09/25/2018   Florencia Reasons MD, PhD  [facility] Pager [phone]. If 7PM-7AM, please contact night-coverage at [email or web address], password [facility] 09/25/2018, 3:27 PM  LOS: 1 day

## 2018-09-26 ENCOUNTER — Inpatient Hospital Stay (HOSPITAL_COMMUNITY)
Admit: 2018-09-26 | Discharge: 2018-09-26 | Disposition: A | Discharge status: Home / Self Care | Payer: Self-pay | Attending: Internal Medicine | Admitting: Internal Medicine

## 2018-09-26 DIAGNOSIS — G9341 Metabolic encephalopathy: Secondary | ICD-10-CM

## 2018-09-26 DIAGNOSIS — E860 Dehydration: Secondary | ICD-10-CM

## 2018-09-26 DIAGNOSIS — E870 Hyperosmolality and hypernatremia: Secondary | ICD-10-CM

## 2018-09-26 DIAGNOSIS — F1124 Opioid dependence with opioid-induced mood disorder: Secondary | ICD-10-CM

## 2018-09-26 DIAGNOSIS — Z720 Tobacco use: Secondary | ICD-10-CM

## 2018-09-26 DIAGNOSIS — Z7289 Other problems related to lifestyle: Secondary | ICD-10-CM

## 2018-09-26 DIAGNOSIS — R4182 Altered mental status, unspecified: Secondary | ICD-10-CM

## 2018-09-26 DIAGNOSIS — F1193 Opioid use, unspecified with withdrawal: Secondary | ICD-10-CM

## 2018-09-26 DIAGNOSIS — F1910 Other psychoactive substance abuse, uncomplicated: Secondary | ICD-10-CM

## 2018-09-26 DIAGNOSIS — F1123 Opioid dependence with withdrawal: Secondary | ICD-10-CM

## 2018-09-26 DIAGNOSIS — E876 Hypokalemia: Secondary | ICD-10-CM

## 2018-09-26 LAB — CBC WITH DIFFERENTIAL/PLATELET
Abs Immature Granulocytes: 0.02 10*3/uL (ref 0.00–0.07)
Basophils Absolute: 0 10*3/uL (ref 0.0–0.1)
Basophils Relative: 0 %
Eosinophils Absolute: 0.1 10*3/uL (ref 0.0–0.5)
Eosinophils Relative: 2 %
HCT: 35.3 % — ABNORMAL LOW (ref 39.0–52.0)
Hemoglobin: 12.4 g/dL — ABNORMAL LOW (ref 13.0–17.0)
Immature Granulocytes: 0 %
Lymphocytes Relative: 23 %
Lymphs Abs: 1.5 10*3/uL (ref 0.7–4.0)
MCH: 31.3 pg (ref 26.0–34.0)
MCHC: 35.1 g/dL (ref 30.0–36.0)
MCV: 89.1 fL (ref 80.0–100.0)
Monocytes Absolute: 0.5 10*3/uL (ref 0.1–1.0)
Monocytes Relative: 8 %
Neutro Abs: 4.2 10*3/uL (ref 1.7–7.7)
Neutrophils Relative %: 67 %
Platelets: 172 10*3/uL (ref 150–400)
RBC: 3.96 MIL/uL — ABNORMAL LOW (ref 4.22–5.81)
RDW: 11.5 % (ref 11.5–15.5)
WBC: 6.3 10*3/uL (ref 4.0–10.5)
nRBC: 0 % (ref 0.0–0.2)

## 2018-09-26 LAB — BASIC METABOLIC PANEL
Anion gap: 7 (ref 5–15)
BUN: 11 mg/dL (ref 6–20)
CO2: 23 mmol/L (ref 22–32)
Calcium: 8.5 mg/dL — ABNORMAL LOW (ref 8.9–10.3)
Chloride: 111 mmol/L (ref 98–111)
Creatinine, Ser: 0.89 mg/dL (ref 0.61–1.24)
GFR calc Af Amer: >60 mL/min (ref >60)
GFR calc non Af Amer: >60 mL/min (ref >60)
Glucose, Bld: 110 mg/dL — ABNORMAL HIGH (ref 70–99)
Potassium: 3.1 mmol/L — ABNORMAL LOW (ref 3.5–5.1)
Sodium: 141 mmol/L (ref 135–145)

## 2018-09-26 LAB — GLUCOSE, CAPILLARY: Glucose-Capillary: 144 mg/dL — ABNORMAL HIGH (ref 70–99)

## 2018-09-26 LAB — MAGNESIUM: Magnesium: 2.2 mg/dL (ref 1.7–2.4)

## 2018-09-26 LAB — URINE CULTURE: Culture: NO GROWTH

## 2018-09-26 MED ORDER — KCL IN DEXTROSE-NACL 40-5-0.45 MEQ/L-%-% IV SOLN
INTRAVENOUS | Status: DC
  Administered 2018-09-26 – 2018-09-27 (×2): via INTRAVENOUS
  Filled 2018-09-26 (×2): qty 1000

## 2018-09-26 MED ORDER — POTASSIUM CHLORIDE CRYS ER 20 MEQ PO TBCR
40.0000 meq | EXTENDED_RELEASE_TABLET | ORAL | Status: AC
  Administered 2018-09-26 (×2): 40 meq via ORAL
  Filled 2018-09-26 (×2): qty 2

## 2018-09-26 NOTE — Plan of Care (Signed)

## 2018-09-26 NOTE — Procedures (Signed)
ELECTROENCEPHALOGRAM REPORT   Patient: Joshua Mcdowell       Room #: 5465 EEG No. ID: [id number] Age: 38 y.o.        Sex: male Referring Physician: Jonnie Finner Report Date:  09/26/2018        Interpreting Physician: Alexis Goodell  History: Joshua Mcdowell is an 38 y.o. male with twitching  Medications:  Zoloft, Catapres, MVI  Conditions of Recording:  This is a 21 channel routine scalp EEG performed with bipolar and monopolar montages arranged in accordance to the international 10/20 system of electrode placement. One channel was dedicated to EKG recording.  The patient is in the awake and drowsy states.  Description:  The waking background activity consists of a low voltage, symmetrical, fairly well organized, 10 Hz alpha activity, seen from the parieto-occipital and posterior temporal regions.  Low voltage fast activity, poorly organized, is seen anteriorly and is at times superimposed on more posterior regions.  A mixture of theta and alpha rhythms are seen from the central and temporal regions. The patient drowses with slowing to irregular, low voltage theta and beta activity.   Stage II sleep is not obtained. No epileptiform activity is noted.   Hyperventilation and intermittent photic stimulation were not performed.  IMPRESSION: Normal electroencephalogram, awake and drowsy. There are no focal lateralizing or epileptiform features.   Alexis Goodell, MD Neurology [phone] 09/26/2018, 2:22 PM

## 2018-09-26 NOTE — Progress Notes (Signed)
Patient remains without other cardiac symptoms such as chest pain, SOB, and BP is steady at 120/75.  Received orders from Hospitalist to hold morning clonidine.

## 2018-09-26 NOTE — Progress Notes (Signed)
EEG complete - results pending 

## 2018-09-26 NOTE — Plan of Care (Signed)
  Problem: Education: Goal: Knowledge of General Education information will improve Description Including pain rating scale, medication(s)/side effects and non-pharmacologic comfort measures Outcome: Progressing   Problem: Clinical Measurements: Goal: Ability to maintain clinical measurements within normal limits will improve Outcome: Progressing   Problem: Activity: Goal: Risk for activity intolerance will decrease Outcome: Progressing   

## 2018-09-26 NOTE — Progress Notes (Signed)
PROGRESS NOTE  Randall HissMatthew Rindfleisch QQV:[id number] DOB: 09/24/1980 DOA: 09/23/2018 PCP: Patient, No Pcp Per  HPI/Recap of past 24 hours:  Appear lucid, still tremors.  EEG negative.  He desires to go to behavioral health once medically improved.   denies pain, no fever  Assessment/Plan: Principal Problem:   Acute metabolic encephalopathy Active Problems:   Depression   Anxiety   AKI (acute kidney injury) (HCC)   Leukocytosis   Polysubstance abuse (HCC)   Tobacco abuse   Alcohol use   Opioid dependence with opioid-induced mood disorder (HCC)   Major depressive disorder, recurrent episode, moderate (HCC)   Hypokalemia   Hypernatremia  Acute toxic encephalopathy -Per ER intake "roommate called EMS reports pt has not moved from bed in 2 days. Pt has been incontinent for 2 days at home in bed. Pt denies taking medications. EMS reports pt has been twitching and pt did not want to go with EMS at first. EMS reports a bowl of white powder substance by bed. " he is found covered in fecal matter upon arrival -CT head is negative for acute intracranial abnormalities -no fever, denies neck pain, ua and cxr negative -uds negative -EEG negative -continue hydration and CIWA another 24 hrs  - lab holiday tomorrow  Hypernatremia: -sodium 146 -normalized with  d5half saline, continue hydration till he is able to eat more  Hypokalemia: Replacing K w/ 40 mEq bid   AKI -likely from dehydration, urine is concentrated -bun/cr 34/1.34 on presentation > corrected now  Leukocytosis: Wbc 14.4, normalized with  ivf No fever, blood culture in process, denies neck pain, ua and cxr negative   Polysubstance abuse, anxiety/depression -he reports feeling depressed after girlfriend and sister died from drug overdose -psychiatry consulted who recommend start clonidine detox protocol and  inpatient psych placement once medically cleared    Code Status: full  Family Communication: patient    Disposition Plan: discharge to inpatient psych facility once medically stable   Vinson Moselleob Khamila Bassinger MD  pgr [phone] 09/26/2018, 3:32 PM   Consultants:  psychiatry  Procedures:  none  Antibiotics:  none   Objective: BP 140/81   Pulse 62   Temp 98.3 F (36.8 C) (Oral)   Resp 13   Ht 5\' 10"  (1.778 m)   Wt 72.4 kg   SpO2 100%   BMI 22.90 kg/m   Intake/Output Summary (Last 24 hours) at 09/26/2018 1526 Last data filed at 09/26/2018 1353 Gross per 24 hour  Intake 1586.47 ml  Output 2500 ml  Net -913.53 ml   Filed Weights   09/24/18 0143  Weight: 72.4 kg    Exam: Patient is examined daily including today on 09/26/2018, exams remain the same as of yesterday except that has changed    General:  More lucid, less tremor  Cardiovascular: RRR  Respiratory: CTABL  Abdomen: Soft/ND/NT, positive BS  Musculoskeletal: No Edema  Neuro: More lucid, less tremor  Data Reviewed: Basic Metabolic Panel: Recent Labs  Lab 09/23/18 1830 09/24/18 0656 09/25/18 0547 09/26/18 0558  NA 146* 146* 142 141  K 4.2 3.5 3.0* 3.1*  CL 109 115* 110 111  CO2 23 21* 23 23  GLUCOSE 130* 120* 77 110*  BUN 34* 27* 16 11  CREATININE 1.34* 0.99 1.03 0.89  CALCIUM 9.4 8.9 8.3* 8.5*  MG  --   --  2.4 2.2   Liver Function Tests: Recent Labs  Lab 09/23/18 1830  AST 20  ALT 23  ALKPHOS 60  BILITOT 0.8  PROT 8.5*  ALBUMIN 5.2*   No results for input(s): LIPASE, AMYLASE in the last 168 hours. No results for input(s): AMMONIA in the last 168 hours. CBC: Recent Labs  Lab 09/23/18 1746 09/24/18 0656 09/25/18 0547 09/26/18 0558  WBC 14.4* 10.8* 7.0 6.3  NEUTROABS  --   --  4.8 4.2  HGB 15.9 13.9 12.8* 12.4*  HCT 45.3 41.7 37.8* 35.3*  MCV 89.9 92.7 91.3 89.1  PLT 218 228 186 172   Cardiac Enzymes:   Recent Labs  Lab 09/23/18 1830  CKTOTAL 328   BNP (last 3 results) No results for input(s): BNP in the last 8760 hours.  ProBNP (last 3 results) No results for input(s):  PROBNP in the last 8760 hours.  CBG: Recent Labs  Lab 09/24/18 0740 09/25/18 0725 09/26/18 0850  GLUCAP 121* 103* 144*    Recent Results (from the past 240 hour(s))  SARS Coronavirus 2 (CEPHEID - Performed in [facility] lab), Hosp Order     Status: None   Collection Time: 09/23/18  5:47 PM   Specimen: Nasopharyngeal Swab  Result Value Ref Range Status   SARS Coronavirus 2 NEGATIVE NEGATIVE Final    Comment: (NOTE) If result is NEGATIVE SARS-CoV-2 target nucleic acids are NOT DETECTED. The SARS-CoV-2 RNA is generally detectable in upper and lower  respiratory specimens during the acute phase of infection. The lowest  concentration of SARS-CoV-2 viral copies this assay can detect is 250  copies / mL. A negative result does not preclude SARS-CoV-2 infection  and should not be used as the sole basis for treatment or other  patient management decisions.  A negative result may occur with  improper specimen collection / handling, submission of specimen other  than nasopharyngeal swab, presence of viral mutation(s) within the  areas targeted by this assay, and inadequate number of viral copies  (<250 copies / mL). A negative result must be combined with clinical  observations, patient history, and epidemiological information. If result is POSITIVE SARS-CoV-2 target nucleic acids are DETECTED. The SARS-CoV-2 RNA is generally detectable in upper and lower  respiratory specimens dur ing the acute phase of infection.  Positive  results are indicative of active infection with SARS-CoV-2.  Clinical  correlation with patient history and other diagnostic information is  necessary to determine patient infection status.  Positive results do  not rule out bacterial infection or co-infection with other viruses. If result is PRESUMPTIVE POSTIVE SARS-CoV-2 nucleic acids MAY BE PRESENT.   A presumptive positive result was obtained on the submitted specimen  and confirmed on repeat  testing.  While 2019 novel coronavirus  (SARS-CoV-2) nucleic acids may be present in the submitted sample  additional confirmatory testing may be necessary for epidemiological  and / or clinical management purposes  to differentiate between  SARS-CoV-2 and other Sarbecovirus currently known to infect humans.  If clinically indicated additional testing with an alternate test  methodology [phone] is advised. The SARS-CoV-2 RNA is generally  detectable in upper and lower respiratory sp ecimens during the acute  phase of infection. The expected result is Negative. Fact Sheet for Patients:  BoilerBrush.com.cyhttps://www.fda.gov/media/136312/download Fact Sheet for Healthcare Providers: https://pope.com/https://www.fda.gov/media/136313/download This test is not yet approved or cleared by the [place] FDA and has been authorized for detection and/or diagnosis of SARS-CoV-2 by FDA under an Emergency Use Authorization (EUA).  This EUA will remain in effect (meaning this test can be used) for the duration of the COVID-19 declaration under Section 564(b)(1) of the Act, 21 U.S.C.  section 360bbb-3(b)(1), unless the authorization is terminated or revoked sooner. Performed at [facility], [place] [address] [place], [place] [address]   Urine Culture     Status: None   Collection Time: 09/23/18 11:51 PM   Specimen: Urine, Random  Result Value Ref Range Status   Specimen Description   Final    URINE, RANDOM Performed at [place] [address], [place], [place] [address]    Special Requests   Final    NONE Performed at [facility], [place] [address] [place], [place] [address]    Culture   Final    NO GROWTH Performed at [facility], [place] [address], [place], [place] [address]    Report Status 09/26/2018 FINAL  Final  Culture, blood (Routine X 2) w Reflex to ID Panel     Status: None (Preliminary result)   Collection Time: 09/24/18  6:56 AM   Specimen:  BLOOD  Result Value Ref Range Status   Specimen Description   Final    BLOOD RIGHT ANTECUBITAL Performed at [facility] [address], [place], [place] [address]    Special Requests   Final    BOTTLES DRAWN AEROBIC ONLY Blood Culture adequate volume Performed at [facility] [address] [place], [place] [address]    Culture   Final    NO GROWTH 2 DAYS Performed at [facility] [address], [place], [place] [address]    Report Status PENDING  Incomplete  Culture, blood (Routine X 2) w Reflex to ID Panel     Status: None (Preliminary result)   Collection Time: 09/24/18  6:56 AM   Specimen: BLOOD RIGHT HAND  Result Value Ref Range Status   Specimen Description   Final    BLOOD RIGHT HAND Performed at [facility] [address] [place], [place] [address]    Special Requests   Final    BOTTLES DRAWN AEROBIC ONLY Blood Culture adequate volume Performed at [facility] [address] [place], [place] [address]    Culture   Final    NO GROWTH 2 DAYS Performed at [facility] [address] [place],  [address]    Report Status PENDING  Incomplete     Studies: No results found.  Scheduled Meds: . cloNIDine  0.1 mg Oral BH-qamhs   Followed by  . [START ON 09/28/2018] cloNIDine  0.1 mg Oral Daily  . enoxaparin (LOVENOX) injection  40 mg Subcutaneous Daily  . folic acid  1 mg Oral Daily  . multivitamin with minerals  1 tablet Oral Daily  . sertraline  25 mg Oral Daily  . thiamine  100 mg Oral Daily   Or  . thiamine  100 mg Intravenous Daily    Continuous Infusions: . dextrose 5 % and 0.45% NaCl 125 mL/hr at 09/26/18 1318     Time spent: 68mins I have personally reviewed and interpreted on  09/26/2018 daily labs, tele strips, imagings as discussed above under date review session and assessment and plans.  I reviewed all nursing notes, pharmacy notes, consultant  notes,  vitals, pertinent old records  I have discussed plan of care as described above with RN , patient  on 09/26/2018   Sol Blazing MD, PhD  [facility] Pager [phone]. If 7PM-7AM, please contact night-coverage at [email or web address], password [facility] 09/26/2018, 3:26 PM  LOS: 2 days

## 2018-09-27 ENCOUNTER — Other Ambulatory Visit: Payer: Self-pay | Admitting: Behavioral Health

## 2018-09-27 ENCOUNTER — Inpatient Hospital Stay (HOSPITAL_COMMUNITY)
Admission: AD | Admit: 2018-09-27 | Discharge: 2018-09-30 | DRG: 881 | Disposition: A | Discharge status: Home / Self Care | Payer: Federal, State, Local not specified - Other | Source: Intra-hospital | Attending: Psychiatry | Admitting: Psychiatry

## 2018-09-27 ENCOUNTER — Encounter (HOSPITAL_COMMUNITY): Payer: Self-pay

## 2018-09-27 DIAGNOSIS — Z634 Disappearance and death of family member: Secondary | ICD-10-CM | POA: Diagnosis not present

## 2018-09-27 DIAGNOSIS — F329 Major depressive disorder, single episode, unspecified: Secondary | ICD-10-CM | POA: Diagnosis present

## 2018-09-27 DIAGNOSIS — F17200 Nicotine dependence, unspecified, uncomplicated: Secondary | ICD-10-CM | POA: Diagnosis present

## 2018-09-27 DIAGNOSIS — F332 Major depressive disorder, recurrent severe without psychotic features: Secondary | ICD-10-CM | POA: Diagnosis not present

## 2018-09-27 DIAGNOSIS — N179 Acute kidney failure, unspecified: Secondary | ICD-10-CM

## 2018-09-27 DIAGNOSIS — R627 Adult failure to thrive: Secondary | ICD-10-CM | POA: Diagnosis present

## 2018-09-27 DIAGNOSIS — F419 Anxiety disorder, unspecified: Secondary | ICD-10-CM

## 2018-09-27 DIAGNOSIS — F1120 Opioid dependence, uncomplicated: Secondary | ICD-10-CM | POA: Diagnosis present

## 2018-09-27 DIAGNOSIS — R400 Somnolence: Secondary | ICD-10-CM

## 2018-09-27 LAB — GLUCOSE, CAPILLARY: Glucose-Capillary: 120 mg/dL — ABNORMAL HIGH (ref 70–99)

## 2018-09-27 MED ORDER — ACETAMINOPHEN 325 MG PO TABS
650.0000 mg | ORAL_TABLET | Freq: Four times a day (QID) | ORAL | Status: DC | PRN
  Administered 2018-09-28 – 2018-09-30 (×5): 650 mg via ORAL
  Filled 2018-09-27 (×4): qty 2

## 2018-09-27 MED ORDER — TRAZODONE HCL 50 MG PO TABS
50.0000 mg | ORAL_TABLET | Freq: Every evening | ORAL | Status: DC | PRN
  Administered 2018-09-27 – 2018-09-28 (×2): 50 mg via ORAL
  Filled 2018-09-27 (×3): qty 1

## 2018-09-27 MED ORDER — SERTRALINE HCL 25 MG PO TABS: 25.0000 mg | ORAL_TABLET | Freq: Every day | ORAL | 2 refills | Status: DC

## 2018-09-27 MED ORDER — CLONIDINE HCL 0.1 MG PO TABS: 0.1000 mg | ORAL_TABLET | Freq: Every day | ORAL | 0 refills | Status: DC

## 2018-09-27 MED ORDER — ALUM & MAG HYDROXIDE-SIMETH 200-200-20 MG/5ML PO SUSP: 30.0000 mL | ORAL | Status: DC | PRN

## 2018-09-27 NOTE — Progress Notes (Signed)
Joshua Mcdowell is a 38 y.o. male Voluntary admitted for AMS and substance abuse. Pt stated he had been in his room for 2 weeks not getting out. His room mate got concerned and called EMS. Pt stated at that time he did not know what was going on and can not remember much. Pt stated he has had multiple deaths in his family and does not know how to cope. His sister, his mother, and his girlfriend all died of drug overdose. Pt has been experiencing anxiety and increased depression. Pt has not been sleeping well, pt live with the room mate and will go back to living with the room mate. 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 Pt's oriented x 4, calm and cooperative with admission process. Consents signed, skin/belongings search completed and pt oriented to unit. Pt stable at this time. Pt given the opportunity to express concerns and ask questions. Pt given toiletries and food. Will continue to monitor.

## 2018-09-27 NOTE — Discharge Summary (Signed)
Physician Discharge Summary  Patient ID: Joshua HissMatthew Weigel MRN: [id number] DOB/AGE: 37/07/1980 37 y.o.  Admit date: 09/23/2018 Discharge date: 09/27/2018  Admission Diagnoses: Principal Problem:   Acute metabolic encephalopathy Active Problems:   Depression   Anxiety   AKI (acute kidney injury) (HCC)   Leukocytosis   Polysubstance abuse (HCC)   Tobacco abuse   Alcohol use  Discharge Diagnoses:  Principal Problem:   Acute metabolic encephalopathy Active Problems:   Depression   Anxiety   AKI (acute kidney injury) (HCC)   Leukocytosis   Polysubstance abuse (HCC)   Tobacco abuse   Alcohol use   Opioid dependence with opioid-induced mood disorder (HCC)   Major depressive disorder, recurrent episode, moderate (HCC)   Hypokalemia   Hypernatremia   Altered mental status   Opioid withdrawal (HCC)   Discharged Condition: good  HPI: Joshua Mcdowell is a 38 y.o. male with medical history significant of polysubstance abuse (tobacco, alcohol, cocaine, heroin), depression, anxiety, who presents with altered mental status.   Patient has AMS, and is unable to provide accurate medical history, therefore, most of the history is obtained by discussing the case with ED physician, per EMS report, and with the nursing staff. Per EMS report pt's roommate called EMS, and reported that pt has not moved from bed in 2 days. Pt has been confused and covered with feces. When I saw pt in ED, he is confused, knows his own name, knows he is in hospital, but not oriented to the time.  He moves all extremities normally.  No facial droop or slurred speech.  He is tremulous.  I asked him if he drinks alcohol and he answered yes.  He cannot give detailed information about drinking history.  Patient does not have active nausea, vomiting or diarrhea noted.  No active respiratory distress, cough noted.  Not sure if patient has symptoms of UTI. ED Course: pt was found to have WBC 14.4, negative urinalysis, negative  COVID-19 test, CK 328, alcohol level less than 10, Tylenol level less than 10, salicylate level less than 7, AKI with creatinine 1.34, BUN 14, temperature normal, blood pressure 128/88, heart rate 50 to 60s, oxygen saturation 99% on room air.  Chest x-ray negative.  CT head is negative for acute intracranial abnormalities.  Hospital Course:  Depression and Anxiety: not taking medications at home -we started Zoloft - sp CIWA - [facility]  Acute metabolic encephalopathy: Etiology is not clear.  CT head negative for acute intracranial abnormalities.  No focal neurologic findings on physical examination.  Patient is tremulous on interview, indicating possible alcohol withdrawal.  Other differential diagnosis include polysubstance abuse.  Patient has leukocytosis with WBC 14.4, but no fever. Clinically did not seem to have sepsis.  Urinalysis negative.  Chest x-ray negative. - patient's confusion resolved over 24-48 hrs, to baseline normal mentation - rec'd CIWA protocol  AKI: Likely due to prerenal secondary to dehydration - improved to normal   Leukocytosis: WBC 14.4.  No fever.  Urinalysis negative.  Chest x-ray negative.  Clinically not septic.  May be due to reactive response per - Follow-up urinalysis, urine culture and blood culture.   - Did not give any abx  Polysubstance abuse (cocaine, heroin, tobacco, alcohol): - UDS was negative x 7 substances -Nicotine patch -need counseling about importance of quitting substance use when mental status improves.    Discharge Exam: Blood pressure (!) 154/127, pulse (!) 59, temperature 98.5 F (36.9 C), temperature source Oral, resp. rate 18, height  5\' 10"  (1.778 m), weight 72.4 kg, SpO2 92 %. Pt is calm , no tremors today  no jvd  chest cta   Cor reg no mrg  Abd soft ntnd no ascites  Ext no edema  NF, Ox 3.   Disposition: Discharge disposition: [place] Not Defined        Allergies as of  09/27/2018   No Known Allergies     Medication List    TAKE these medications   cloNIDine 0.1 MG tablet Commonly known as: CATAPRES Take 1 tablet (0.1 mg total) by mouth daily for 2 days. Start taking on: September 28, 2018   sertraline 25 MG tablet Commonly known as: Zoloft Take 1 tablet (25 mg total) by mouth daily.        Signed: Sandy Salaam Enedelia Martorelli 09/27/2018, 2:47 PM

## 2018-09-27 NOTE — Progress Notes (Signed)
Pt taken via wheelchair to main entrance to be picked up by [facility] and taken to [facility]. Pt had no belongings in room and stated he brought nothing with him to the hospital. Pt had no concerns or complaints.

## 2018-09-27 NOTE — TOC Transition Note (Signed)
Transition of Care [facility]) - CM/SW Discharge Note   Patient Details  Name: Joshua Mcdowell MRN: [id number] Date of Birth: [birth year]  Transition of Care [facility]) CM/SW Contact:  Dessa Phi, RN Phone Number: 09/27/2018, 2:06 PM   Clinical Narrative: Voluntary commitment forms signed-copy in chart. TC [facility] bed available-rm 399, Dr. Mallie Darting, faxed w/confirmation to [facility] commitment form. TC [facility] spoke to Chris-informed of pick up time 9p, forms in shadow chart. No further CM needs.     Final next level of care: Psychiatric Hospital Barriers to Discharge: Other (comment)(awaiting if psych bed available)   Patient Goals and CMS Choice        Discharge Placement                       Discharge Plan and Services   Discharge Planning Services: CM Consult                                 Social Determinants of Health (SDOH) Interventions     Readmission Risk Interventions No flowsheet data found.

## 2018-09-27 NOTE — TOC Initial Note (Signed)
Transition of Care [facility]) - Initial/Assessment Note    Patient Details  Name: Joshua Mcdowell MRN: [id number] Date of Birth: [birth year]  Transition of Care [facility]) CM/SW Contact:    Dessa Phi, RN Phone Number: 09/27/2018, 12:42 PM  Clinical Narrative:  [facility] spoke to Tina-informed of medicall stablility,await if able to accept.                 Expected Discharge Plan: Psychiatric Hospital Barriers to Discharge: Other (comment)(awaiting if psych bed available)   Patient Goals and CMS Choice        Expected Discharge Plan and Services Expected Discharge Plan: [facility]   Discharge Planning Services: CM Consult   Living arrangements for the past 2 months: Single Family Home Expected Discharge Date: 09/26/18                                    Prior Living Arrangements/Services Living arrangements for the past 2 months: Single Family Home Lives with:: Roommate Patient language and need for interpreter reviewed:: Yes Do you feel safe going back to the place where you live?: No   IP Psych  Need for Family Participation in Patient Care: No (Comment) Care giver support system in place?: Yes (comment)   Criminal Activity/Legal Involvement Pertinent to Current Situation/Hospitalization: No - Comment as needed  Activities of Daily Living Home Assistive Devices/Equipment: None ADL Screening (condition at time of admission) Patient's cognitive ability adequate to safely complete daily activities?: Yes Is the patient deaf or have difficulty hearing?: No Does the patient have difficulty seeing, even when wearing glasses/contacts?: No Does the patient have difficulty concentrating, remembering, or making decisions?: No Patient able to express need for assistance with ADLs?: No Does the patient have difficulty dressing or bathing?: No Independently performs ADLs?: Yes (appropriate for developmental age)(at baseline ) Does the patient have difficulty walking or  climbing stairs?: No Weakness of Legs: Both Weakness of Arms/Hands: None  Permission Sought/Granted Permission sought to share information with : Case Manager Permission granted to share information with : Yes, Verbal Permission Granted              Emotional Assessment Appearance:: Appears stated age Attitude/Demeanor/Rapport: Gracious Affect (typically observed): Depressed Orientation: : Oriented to Self, Oriented to Place, Oriented to  Time, Oriented to Situation Alcohol / Substance Use: Tobacco Use, Alcohol Use, Illicit Drugs Psych Involvement: Yes (comment)  Admission diagnosis:  Dehydration [E86.0] Altered mental status, unspecified altered mental status type [R41.82] Altered behavior [R46.89] Patient Active Problem List   Diagnosis Date Noted  . Altered mental status   . Opioid withdrawal ([place])   . Hypokalemia   . Hypernatremia   . Tobacco abuse 09/24/2018  . Alcohol use 09/24/2018  . Opioid dependence with opioid-induced mood disorder ([place]) 09/24/2018  . Major depressive disorder, recurrent episode, moderate ([place]) 09/24/2018  . Depression   . Anxiety   . Acute metabolic encephalopathy   . AKI (acute kidney injury) ([place])   . Leukocytosis   . Polysubstance abuse ([place])   . Dehydration    PCP:  Patient, No Pcp Per Pharmacy:   [id number] - [place], [place] [address] [place] [address] Phone: [phone] Fax: [phone]  [place], [place] [address] Phone: [phone] Fax: [phone]  Social Determinants of Health (SDOH) Interventions    Readmission Risk Interventions No flowsheet data found.

## 2018-09-28 ENCOUNTER — Other Ambulatory Visit: Payer: Self-pay

## 2018-09-28 DIAGNOSIS — Z634 Disappearance and death of family member: Secondary | ICD-10-CM

## 2018-09-28 DIAGNOSIS — F332 Major depressive disorder, recurrent severe without psychotic features: Secondary | ICD-10-CM

## 2018-09-28 MED ORDER — PRENATAL MULTIVITAMIN CH
1.0000 | ORAL_TABLET | Freq: Every day | ORAL | Status: DC
  Administered 2018-09-28 – 2018-09-30 (×3): 1 via ORAL
  Filled 2018-09-28: qty 7
  Filled 2018-09-28 (×5): qty 1

## 2018-09-28 MED ORDER — HYDROXYZINE HCL 25 MG PO TABS
25.0000 mg | ORAL_TABLET | Freq: Four times a day (QID) | ORAL | Status: DC | PRN
  Administered 2018-09-28 – 2018-09-30 (×6): 25 mg via ORAL
  Filled 2018-09-28 (×6): qty 1

## 2018-09-28 MED ORDER — TRAZODONE HCL 100 MG PO TABS
100.0000 mg | ORAL_TABLET | Freq: Once | ORAL | Status: AC
  Administered 2018-09-28: 100 mg via ORAL
  Filled 2018-09-28 (×2): qty 1

## 2018-09-28 MED ORDER — TRAZODONE HCL 50 MG PO TABS
50.0000 mg | ORAL_TABLET | Freq: Once | ORAL | Status: AC
  Administered 2018-09-28: 01:00:00 50 mg via ORAL
  Filled 2018-09-28 (×2): qty 1

## 2018-09-28 MED ORDER — MIRTAZAPINE 15 MG PO TBDP
15.0000 mg | ORAL_TABLET | Freq: Every day | ORAL | Status: DC
  Administered 2018-09-28: 15 mg via ORAL
  Filled 2018-09-28 (×3): qty 1

## 2018-09-28 MED ORDER — MODAFINIL 200 MG PO TABS
100.0000 mg | ORAL_TABLET | Freq: Every day | ORAL | Status: DC
  Administered 2018-09-28 – 2018-09-29 (×2): 100 mg via ORAL
  Filled 2018-09-28 (×2): qty 1

## 2018-09-28 MED ORDER — FLUOXETINE HCL 20 MG PO CAPS
20.0000 mg | ORAL_CAPSULE | Freq: Every day | ORAL | Status: DC
  Administered 2018-09-28 – 2018-09-30 (×3): 20 mg via ORAL
  Filled 2018-09-28 (×3): qty 1
  Filled 2018-09-28: qty 7
  Filled 2018-09-28 (×3): qty 1

## 2018-09-28 NOTE — Tx Team (Signed)
Interdisciplinary Treatment and Diagnostic Plan Update  09/28/2018 Time of Session: 9:00am Joshua Mcdowell MRN: [id number]  Principal Diagnosis: <principal problem not specified>  Secondary Diagnoses: Active Problems:   MDD (major depressive disorder)   Current Medications:  Current Facility-Administered Medications  Medication Dose Route Frequency Provider Last Rate Last Dose  . acetaminophen (TYLENOL) tablet 650 mg  650 mg Oral Q6H PRN Mordecai Maes, NP      . alum & mag hydroxide-simeth (MAALOX/MYLANTA) 200-200-20 MG/5ML suspension 30 mL  30 mL Oral Q4H PRN Mordecai Maes, NP      . FLUoxetine (PROZAC) capsule 20 mg  20 mg Oral Daily Johnn Hai, MD      . hydrOXYzine (ATARAX/VISTARIL) tablet 25 mg  25 mg Oral Q6H PRN Rozetta Nunnery, NP   25 mg at 09/28/18 0116  . mirtazapine (REMERON SOL-TAB) disintegrating tablet 15 mg  15 mg Oral QHS Johnn Hai, MD      . modafinil (PROVIGIL) tablet 100 mg  100 mg Oral Daily Johnn Hai, MD      . prenatal vitamin w/FE, FA (NATACHEW) chewable tablet 1 tablet  1 tablet Oral Q1200 Johnn Hai, MD      . traZODone (DESYREL) tablet 50 mg  50 mg Oral QHS PRN Rozetta Nunnery, NP   50 mg at 09/27/18 2236   PTA Medications: Medications Prior to Admission  Medication Sig Dispense Refill Last Dose  . cloNIDine (CATAPRES) 0.1 MG tablet Take 1 tablet (0.1 mg total) by mouth daily for 2 days. 2 tablet 0   . sertraline (ZOLOFT) 25 MG tablet Take 1 tablet (25 mg total) by mouth daily. 30 tablet 2     Patient Stressors: Financial difficulties Substance abuse Traumatic event  Patient Strengths: Average or above average intelligence Communication skills Work skills  Treatment Modalities: Medication Management, Group therapy, Case management,  1 to 1 session with clinician, Psychoeducation, Recreational therapy.   Physician Treatment Plan for Primary Diagnosis: <principal problem not specified> Long Term Goal(s):     Short Term Goals:     Medication Management: Evaluate patient's response, side effects, and tolerance of medication regimen.  Therapeutic Interventions: 1 to 1 sessions, Unit Group sessions and Medication administration.  Evaluation of Outcomes: Not Met  Physician Treatment Plan for Secondary Diagnosis: Active Problems:   MDD (major depressive disorder)  Long Term Goal(s):     Short Term Goals:       Medication Management: Evaluate patient's response, side effects, and tolerance of medication regimen.  Therapeutic Interventions: 1 to 1 sessions, Unit Group sessions and Medication administration.  Evaluation of Outcomes: Not Met   RN Treatment Plan for Primary Diagnosis: <principal problem not specified> Long Term Goal(s): Knowledge of disease and therapeutic regimen to maintain health will improve  Short Term Goals: Ability to identify and develop effective coping behaviors will improve  Medication Management: RN will administer medications as ordered by provider, will assess and evaluate patient's response and provide education to patient for prescribed medication. RN will report any adverse and/or side effects to prescribing provider.  Therapeutic Interventions: 1 on 1 counseling sessions, Psychoeducation, Medication administration, Evaluate responses to treatment, Monitor vital signs and CBGs as ordered, Perform/monitor CIWA, COWS, AIMS and Fall Risk screenings as ordered, Perform wound care treatments as ordered.  Evaluation of Outcomes: Not Met   LCSW Treatment Plan for Primary Diagnosis: <principal problem not specified> Long Term Goal(s): Safe transition to appropriate next level of care at discharge, Engage patient in therapeutic group addressing interpersonal concerns.  Short Term Goals: Engage patient in aftercare planning with referrals and resources, Increase social support, Identify triggers associated with mental health/substance abuse issues and Increase skills for wellness and  recovery  Therapeutic Interventions: Assess for all discharge needs, 1 to 1 time with Social worker, Explore available resources and support systems, Assess for adequacy in community support network, Educate family and significant other(s) on suicide prevention, Complete Psychosocial Assessment, Interpersonal group therapy.  Evaluation of Outcomes: Not Met   Progress in Treatment: Attending groups: No. New to unit. Participating in groups: No. Taking medication as prescribed: Yes. Toleration medication: Yes. Family/Significant other contact made: No, will contact:  supports if consents are granted. Patient understands diagnosis: Yes. Discussing patient identified problems/goals with staff: No. Medical problems stabilized or resolved: No. Denies suicidal/homicidal ideation: Yes. Issues/concerns per patient self-inventory: Yes.  New problem(s) identified: Yes, Describe:  grief, limited support system  New Short Term/Long Term Goal(s): detox, medication management for mood stabilization; elimination of SI thoughts; development of comprehensive mental wellness/sobriety plan.  Patient Goals:   Patient is new to the unit and sleeping. Not able to identify a goal at this time.  Discharge Plan or Barriers: Patient is new to the unit. CSW assessing for appropriate referrals.   Reason for Continuation of Hospitalization: Anxiety Depression Medication stabilization Suicidal ideation  Estimated Length of Stay: 3-5 days  Attendees: Patient:  09/28/2018 10:21 AM  Physician: Dr.Farah 09/28/2018 10:21 AM  Nursing: Yetta Flock, RN  09/28/2018 10:21 AM  RN Care Manager: 09/28/2018 10:21 AM  Social Worker: Stephanie Acre, [place] 09/28/2018 10:21 AM  Recreational Therapist:  09/28/2018 10:21 AM  Other:  09/28/2018 10:21 AM  Other:  09/28/2018 10:21 AM  Other: 09/28/2018 10:21 AM    Scribe for Treatment Team: Joellen Jersey, [place] 09/28/2018 10:21 AM

## 2018-09-28 NOTE — H&P (Signed)
Psychiatric Admission Assessment Adult  Patient Identification: Joshua Mcdowell MRN:  [id number] Date of Evaluation:  09/28/2018 Chief Complaint:  mdd opiod dependence with opiod induced mood disorder  Principal Diagnosis: Depression/history of substance dependencies and abuse Diagnosis:  Active Problems:   MDD (major depressive disorder)  History of Present Illness:   Mr. Joshua Mcdowell is [id number] he reports [facility] mainly related to opiate dependency and polysubstance abuse however more recently has been profoundly depressed, affected by the overdose death of sister and girlfriend both from drug overdoses.  This motivated him to stop using drugs and he stays he has been drug-free for several weeks at any rate-he reports he is here because he was "sleeping too much" and roommates were concerned. He did require medical stabilization he was found to be dehydrated he was again not taking care of himself found in his own feces and urine at home.  He has been lethargic sluggish and minimally engaged he actually has a failure to thrive in the context of a severe depression which is unusual despite his age but nonetheless is present he denies auditory or visual hallucinations denies specific thoughts of harming himself but vaguely is deteriorating and passively suicidal in the sense that he is not caring for himself at all  According to the assessment team HPI: Patient who reports history of depression, anxiety and polysubstance abuse (tobacco, alcohol, cocaine, heroin) dating back to 6 years ago. He was admitted to the medical unit due to altered mental status. However, patient reports that his drug of choice is Heroin in the last 5-6 years denies other illicit drugs of abuse. He states that he was tired of using Heroin about 2 weeks ago and decided to self detox with Subuxone which he bought off the street. He states that his roommates brought him to the hospital  yesterday because he was increasingly getting very lethargic, depressed and hopeless. He states that he was sleeping all the day all night for 2 weeks. Patient reports that he has decided to stop using drugs because his sister died from drug overdose November, 2019 and his girlfriend died from overdose in April, 2019. Patient reports fatigue, myalgia, nausea, anxiety but denies psychosis, delusions, SI/HI. He is requesting for detoxification and stabilization.  Associated Signs/Symptoms: Depression Symptoms:  depressed mood, (Hypo) Manic Symptoms:  n/a Anxiety Symptoms:  Excessive Worry, Psychotic Symptoms:  n/a PTSD Symptoms: NA Total Time spent with patient: 45 minutes  Is the patient at risk to self? Yes.    Has the patient been a risk to self in the past 6 months? No.  Has the patient been a risk to self within the distant past? No.  Is the patient a risk to others? No.  Has the patient been a risk to others in the past 6 months? No.  Has the patient been a risk to others within the distant past? No.   Alcohol Screening: 1. How often do you have a drink containing alcohol?: Monthly or less 2. How many drinks containing alcohol do you have on a typical day when you are drinking?: 1 or 2 3. How often do you have six or more drinks on one occasion?: Never AUDIT-C Score: 1 4. How often during the last year have you found that you were not able to stop drinking once you had started?: Never 5. How often during the last year have you failed to do what was normally expected from you becasue of drinking?: Never  6. How often during the last year have you needed a first drink in the morning to get yourself going after a heavy drinking session?: Never 7. How often during the last year have you had a feeling of guilt of remorse after drinking?: Never 8. How often during the last year have you been unable to remember what happened the night before because you had been drinking?: Never 9. Have you  or someone else been injured as a result of your drinking?: No 10. Has a relative or friend or a doctor or another health worker been concerned about your drinking or suggested you cut down?: No Alcohol Use Disorder Identification Test Final Score (AUDIT): 1 Alcohol Brief Interventions/Follow-up: AUDIT Score <7 follow-up not indicated Substance Abuse History in the last 12 months:  Yes.   Consequences of Substance Abuse: NA Previous Psychotropic Medications: Yes  Psychological Evaluations: No  Past Medical History:  Past Medical History:  Diagnosis Date  . Anxiety   . Depression     Past Surgical History:  Procedure Laterality Date  . ORIF ANKLE FRACTURE    . WISDOM TOOTH EXTRACTION     Family History: History reviewed. No pertinent family history. Family Psychiatric  History: MDD Tobacco Screening: Have you used any form of tobacco in the last 30 days? (Cigarettes, Smokeless Tobacco, Cigars, and/or Pipes): No Social History:  Social History   Substance and Sexual Activity  Alcohol Use Yes     Social History   Substance and Sexual Activity  Drug Use Yes  . Types: Cocaine, Heroin   Comment: narcotics, heroine    Additional Social History:      Pain Medications: See MAR Prescriptions: See MAR Over the Counter: See MAR History of alcohol / drug use?: Yes Negative Consequences of Use: Financial, Personal relationships Withdrawal Symptoms: Other (Comment)(none)                    Allergies:  No Known Allergies Lab Results:  Results for orders placed or performed during the hospital encounter of 09/23/18 (from the past 48 hour(s))  Glucose, capillary     Status: Abnormal   Collection Time: 09/27/18  7:59 AM  Result Value Ref Range   Glucose-Capillary 120 (H) 70 - 99 mg/dL    Blood Alcohol level:  Lab Results  Component Value Date   ETH <10 09/23/2018   ETH <5 32/20/2542    Metabolic Disorder Labs:  No results found for: HGBA1C, MPG No results found  for: PROLACTIN No results found for: CHOL, TRIG, HDL, CHOLHDL, VLDL, LDLCALC  Current Medications: Current Facility-Administered Medications  Medication Dose Route Frequency Provider Last Rate Last Dose  . acetaminophen (TYLENOL) tablet 650 mg  650 mg Oral Q6H PRN Mordecai Maes, NP      . alum & mag hydroxide-simeth (MAALOX/MYLANTA) 200-200-20 MG/5ML suspension 30 mL  30 mL Oral Q4H PRN Mordecai Maes, NP      . FLUoxetine (PROZAC) capsule 20 mg  20 mg Oral Daily Johnn Hai, MD      . hydrOXYzine (ATARAX/VISTARIL) tablet 25 mg  25 mg Oral Q6H PRN Rozetta Nunnery, NP   25 mg at 09/28/18 0116  . mirtazapine (REMERON SOL-TAB) disintegrating tablet 15 mg  15 mg Oral QHS Johnn Hai, MD      . modafinil (PROVIGIL) tablet 100 mg  100 mg Oral Daily Johnn Hai, MD      . prenatal vitamin w/FE, FA (NATACHEW) chewable tablet 1 tablet  1 tablet Oral Q1200  Malvin JohnsFarah, Jawanza Zambito, MD      . traZODone (DESYREL) tablet 50 mg  50 mg Oral QHS PRN Jackelyn PolingBerry, Jason A, NP   50 mg at 09/27/18 2236   PTA Medications: Medications Prior to Admission  Medication Sig Dispense Refill Last Dose  . cloNIDine (CATAPRES) 0.1 MG tablet Take 1 tablet (0.1 mg total) by mouth daily for 2 days. 2 tablet 0   . sertraline (ZOLOFT) 25 MG tablet Take 1 tablet (25 mg total) by mouth daily. 30 tablet 2     Musculoskeletal: Strength & Muscle Tone: within normal limits Gait & Station: normal Patient leans: N/A  Psychiatric Specialty Exam: Physical Exam  ROS  Blood pressure 114/70, pulse (!) 103, temperature 99.1 F (37.3 C), temperature source Oral, resp. rate 18, height 5' 10.5" (1.791 m), weight 72.6 kg, SpO2 100 %.Body mass index is 22.63 kg/m.  General Appearance: Casual  Eye Contact:  Minimal  Speech:  Slow  Volume:  Decreased  Mood:  Dysphoric  Affect:  Congruent  Thought Process:  Goal Directed and Descriptions of Associations: Circumstantial  Orientation:  Full (Time, Place, and Person)  Thought Content:   Rumination  Suicidal Thoughts:  Yes.  without intent/plan vague  Homicidal Thoughts:  No  Memory:  Immediate;   Fair  Judgement:  Fair  Insight:  Shallow  Psychomotor Activity:  Decreased  Concentration:  Concentration: Fair  Recall:  [employer]  Fund of Knowledge:  Fair  Language:  Fair  Akathisia:  No  Handed:  Right  AIMS (if indicated):     Assets:  Resilience  ADL's:  Intact  Cognition:  WNL  Sleep:  Number of Hours: 1.25    Treatment Plan Summary: Daily contact with patient to assess and evaluate symptoms and progress in treatment and Medication management  Observation Level/Precautions:  15 minute checks  Laboratory:  UDS  Psychotherapy:    Medications:    Consultations:    Discharge Concerns:    Estimated LOS:  Other:     Physician Treatment Plan for Primary Diagnosis: <principal problem not specified> Long Term Goal(s): Improvement in symptoms so as ready for discharge  Short Term Goals: Ability to verbalize feelings will improve, Ability to disclose and discuss suicidal ideas and Ability to maintain clinical measurements within normal limits will improve  Physician Treatment Plan for Secondary Diagnosis: Active Problems:   MDD (major depressive disorder)  Long Term Goal(s): Improvement in symptoms so as ready for discharge  Short Term Goals: Ability to demonstrate self-control will improve, Ability to identify and develop effective coping behaviors will improve, Ability to maintain clinical measurements within normal limits will improve and Compliance with prescribed medications will improve  I certify that inpatient services furnished can reasonably be expected to improve the patient's condition.    Malvin JohnsFARAH,Abigail Teall, MD 7/15/202010:53 AM

## 2018-09-28 NOTE — [facility] Counselor (Signed)
Adult Comprehensive Assessment  Patient ID: Joshua Mcdowell, male   DOB: 1980/10/15, 38 y.o.   MRN: [id number]  Information Source: Information source: Patient  Current Stressors:  Patient states their primary concerns and needs for treatment are:: "I wasn't getting out of bed, I've been sleeping a lot." Roommate was concerned and called EMS. Altered mental status. 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 Family Relationships: Mother and sister are deceased. Father lives in [place], strained relationship. Financial / Lack of resources (include bankruptcy): Unemployment due to [employer] / Lack of housing: Denies Physical health (include injuries & life threatening diseases): No stressors Social relationships: Close to deceased girlfriend's sister. Not too many friends because patient doesn't use drugs anymore. Substance abuse: Takes Suboxone off the street, he says its cheaper that way. Has stopped other drug use. Bereavement / Loss: Sister died in Feb 02, 2018 of an overdose. Girlfriend died of an overdose in July 03, 2017.  Mother died of an overdose 5 years ago.  Living/Environment/Situation:  Living Arrangements: Non-relatives/Friends Living conditions (as described by patient or guardian): Apartment in [place] Who else lives in the home?: Roommate What is atmosphere in current home: Supportive  Family History:  Marital status: Single Are you sexually active?: No What is your sexual orientation?: Straight Has your sexual activity been affected by drugs, alcohol, medication, or emotional stress?: Emotional Does patient have children?: No  Childhood History:  By whom was/is the patient raised?: Both parents Additional childhood history information: Dad worked a lot. Mom had an addiction to pain pills. Description of  patient's relationship with caregiver when they were a child: Closer to mother than father. Patient's description of current relationship with people who raised him/her: Mother died of a drug overdose, father is living but their relationship is strained. How were you disciplined when you got in trouble as a child/adolescent?: Appropriate, lack of discipline from mother Does patient have siblings?: Yes Number of Siblings: 1 Description of patient's current relationship with siblings: 1 younger sister- she died last year Did patient suffer any verbal/emotional/physical/sexual abuse as a child?: No(Reports parents gave him pain pills as a teenager) Did patient suffer from severe childhood neglect?: No Has patient ever been sexually abused/assaulted/raped as an adolescent or adult?: No Was the patient ever a victim of a crime or a disaster?: No Witnessed domestic violence?: No Has patient been effected by domestic violence as an adult?: No  Education:  Highest grade of school patient has completed: Some college, Runner, broadcasting/film/video Currently a Ship broker?: No Learning disability?: No  Employment/Work Situation:   Employment situation: Unemployed(Laid off due to [employer]) Patient's job has been impacted by current illness: No What is the longest time patient has a held a job?: 5 years Where was the patient employed at that time?: [employer] Did You Receive Any Psychiatric Treatment/Services While in the [employer]?: No Are There Guns or Other Weapons in [place]?: No  Financial Resources:   Financial resources: No income Does patient have a Programmer, applications or guardian?: No  Alcohol/Substance Abuse:   What has been your use of drugs/alcohol within the last 12 months?: Reports he only takes suboxone now, "sometimes xanax." Alcohol/Substance Abuse Treatment Hx: Past Tx, Inpatient, Past Tx, Outpatient, Past detox If yes, describe treatment: [facility] 07-03-2008. Prior outpatient but no current  involvement. Has alcohol/substance abuse ever caused legal problems?: Yes(DUI from Jul 04, 2010)  Social Support System:   Patient's Community Support System: Fair Museum/gallery exhibitions officerDescribe Community Support System: Deceased girlfriend's sister, father, some friends Type of faith/religion: No, Agnostic How does patient's faith help to cope with current illness?: n/a  Leisure/Recreation:   Leisure and Hobbies: "Do drugs"  Strengths/Needs:   What is the patient's perception of their strengths?: "I'm a good bartender," Patient states they can use these personal strengths during their treatment to contribute to their recovery: Unsure Patient states these barriers may affect/interfere with their treatment: "Just not caring anymore."  Discharge Plan:   Currently receiving community mental health services: No Patient states concerns and preferences for aftercare planning are: Not sure. Patient states they will know when they are safe and ready for discharge when: "I'm safe to discharge now, I came out of it in the hospital. I'm good now." Does patient have access to transportation?: Yes(Bus, walking) Does patient have financial barriers related to discharge medications?: Yes Patient description of barriers related to discharge medications: No insurance Will patient be returning to same living situation after discharge?: Yes  Summary/Recommendations:   Summary and Recommendations (to be completed by the evaluator): Joshua Mcdowell is a 38 [place] [facility] [place] [facility] [facility] after his roommate called EMS after finding the patient in an altered mental status. Patient stressors include grieving the losses of his girlfriend, sister, and mother; all due to overdoses. He reports he has been sleeping for weeks and hasn't gotten out of bed. He minimizes his substance use concerns, he states he sometimes uses recreational drugs and he buys suboxone off the street. He declines all referrals.  Patient will benefit from crisis stabilization, therapeutic milieu, referrals for services, and medication management.  Joshua Mcdowell. 09/28/2018

## 2018-09-28 NOTE — Tx Team (Signed)
Initial Treatment Plan 09/28/2018 12:12 AM [name] PNP:[id number]    PATIENT STRESSORS: Financial difficulties Substance abuse Traumatic event   PATIENT STRENGTHS: Average or above average intelligence Communication skills Work skills   PATIENT IDENTIFIED PROBLEMS: Substance Abuse  Anxiety  "Help with grief"  "coping skills"               DISCHARGE CRITERIA:  Ability to meet basic life and health needs Adequate post-discharge living arrangements Improved stabilization in mood, thinking, and/or behavior Medical problems require only outpatient monitoring Motivation to continue treatment in a less acute level of care Reduction of life-threatening or endangering symptoms to within safe limits  PRELIMINARY DISCHARGE PLAN: Attend aftercare/continuing care group Attend PHP/IOP Attend 12-step recovery group Outpatient therapy Return to previous living arrangement Return to previous work or school arrangements  PATIENT/FAMILY INVOLVEMENT: This treatment plan has been presented to and reviewed with the patient, Joshua Mcdowell, and/or family member.The patient and family have been given the opportunity to ask questions and make suggestions.  [name], RN 09/28/2018, 12:12 AM

## 2018-09-28 NOTE — Progress Notes (Signed)
Patient declines all outpatient follow up and referrals. He declines outpatient referrals stating, "no one would give me anything good." He reports his plan is to continue buying Suboxone off the street.   He declines grief counseling referrals at this time as well.  Stephanie Acre, LCSW-A Clinical Social Worker

## 2018-09-28 NOTE — Plan of Care (Signed)

## 2018-09-28 NOTE — [facility] Suicide Risk Assessment (Signed)
[name] INPATIENT:  Family/Significant Other Suicide Prevention Education  Suicide Prevention Education:  Patient Refusal for Family/Significant Other Suicide Prevention Education: The patient Joshua Mcdowell has refused to provide written consent for family/significant other to be provided Family/Significant Other Suicide Prevention Education during admission and/or prior to discharge.  Physician notified.  Joellen Jersey 09/28/2018, 3:06 PM

## 2018-09-28 NOTE — [facility] Suicide Risk Assessment (Signed)
[facility] Admission Suicide Risk Assessment   Nursing information obtained from:  Patient Demographic factors:  Male, Low socioeconomic status, Caucasian Current Mental Status:  NA Loss Factors:  Loss of significant relationship Historical Factors:  Family history of suicide Risk Reduction Factors:  Employed, Living with another person, especially a relative  Total Time spent with patient: 45 minutes Principal Problem: History of substance abuse but more recently severe depression with failure to thrive despite young age Diagnosis:  Active Problems:   MDD (major depressive disorder)  Subjective Data: Patient not caring for himself in bed at home defecating urinating on self so forth acknowledging severe depression  Continued Clinical Symptoms:  Alcohol Use Disorder Identification Test Final Score (AUDIT): 1 The "Alcohol Use Disorders Identification Test", Guidelines for Use in Primary Care, Second Edition.  World Pharmacologist [facility]). Score between 0-7:  no or low risk or alcohol related problems. Score between 8-15:  moderate risk of alcohol related problems. Score between 16-19:  high risk of alcohol related problems. Score 20 or above:  warrants further diagnostic evaluation for alcohol dependence and treatment.   CLINICAL FACTORS:   Depression:   Anhedonia Dysthymia   Musculoskeletal: Strength & Muscle Tone: within normal limits Gait & Station: normal Patient leans: N/A  Psychiatric Specialty Exam: Physical Exam  ROS  Blood pressure 114/70, pulse (!) 103, temperature 99.1 F (37.3 C), temperature source Oral, resp. rate 18, height 5' 10.5" (1.791 m), weight 72.6 kg, SpO2 100 %.Body mass index is 22.63 kg/m.  General Appearance: Casual  Eye Contact:  Minimal  Speech:  Slow  Volume:  Decreased  Mood:  Dysphoric  Affect:  Congruent  Thought Process:  Goal Directed and Descriptions of Associations: Circumstantial  Orientation:  Full (Time, Place, and Person)  Thought  Content:  Rumination  Suicidal Thoughts:  Yes.  without intent/plan vague  Homicidal Thoughts:  No  Memory:  Immediate;   Fair  Judgement:  Fair  Insight:  Shallow  Psychomotor Activity:  Decreased  Concentration:  Concentration: Fair  Recall:  [employer] of Knowledge:  Fair  Language:  Fair  Akathisia:  No  Handed:  Right  AIMS (if indicated):     Assets:  Resilience  ADL's:  Intact  Cognition:  WNL  Sleep:  Number of Hours: 1.25      COGNITIVE FEATURES THAT CONTRIBUTE TO RISK:  None    SUICIDE RISK:   Mild:  Suicidal ideation of limited frequency, intensity, duration, and specificity.  There are no identifiable plans, no associated intent, mild dysphoria and related symptoms, good self-control (both objective and subjective assessment), few other risk factors, and identifiable protective factors, including available and accessible social support.  PLAN OF CARE: see eval  I certify that inpatient services furnished can reasonably be expected to improve the patient's condition.   Johnn Hai, MD 09/28/2018, 10:51 AM

## 2018-09-29 LAB — CULTURE, BLOOD (ROUTINE X 2)
Culture: NO GROWTH
Culture: NO GROWTH
Special Requests: ADEQUATE
Special Requests: ADEQUATE

## 2018-09-29 MED ORDER — TRAZODONE HCL 150 MG PO TABS
300.0000 mg | ORAL_TABLET | Freq: Every evening | ORAL | Status: DC | PRN
  Administered 2018-09-29: 300 mg via ORAL
  Filled 2018-09-29: qty 14
  Filled 2018-09-29: qty 2

## 2018-09-29 MED ORDER — MIRTAZAPINE 15 MG PO TBDP
30.0000 mg | ORAL_TABLET | Freq: Every day | ORAL | Status: DC
  Administered 2018-09-29: 22:00:00 30 mg via ORAL
  Filled 2018-09-29 (×3): qty 1
  Filled 2018-09-29: qty 14

## 2018-09-29 MED ORDER — MODAFINIL 200 MG PO TABS
200.0000 mg | ORAL_TABLET | Freq: Every day | ORAL | Status: DC
  Administered 2018-09-30: 10:00:00 200 mg via ORAL
  Filled 2018-09-29: qty 1

## 2018-09-29 NOTE — Plan of Care (Signed)
D: Patient is alert, oriented, irritable, and cooperative. Denies SI, HI, AVH, and verbally contracts for safety.    A: Medications administered per MD order. Support provided. Patient educated on safety on the unit and medications. Routine safety checks every 15 minutes. Patient stated understanding to tell nurse about any new physical symptoms. Patient understands to tell staff of any needs.     R: No adverse drug reactions noted. Patient verbally contracts for safety. Patient remains safe at this time and will continue to monitor.   Problem: Safety: Goal: Periods of time without injury will increase Outcome: Progressing   [place] NOVEL CORONAVIRUS (COVID-19) DAILY CHECK-OFF SYMPTOMS - answer yes or no to each - every day NO YES  Have you had a fever in the past 24 hours?  Fever (Temp > 37.80C / 100F) X   Have you had any of these symptoms in the past 24 hours? New Cough  Sore Throat   Shortness of Breath  Difficulty Breathing  Unexplained Body Aches   X   Have you had any one of these symptoms in the past 24 hours not related to allergies?   Runny Nose  Nasal Congestion  Sneezing   X   If you have had runny nose, nasal congestion, sneezing in the past 24 hours, has it worsened?  X   EXPOSURES - check yes or no X   Have you traveled outside the state in the past 14 days?  X   Have you been in contact with someone with a confirmed diagnosis of COVID-19 or PUI in the past 14 days without wearing appropriate PPE?  X   Have you been living in the same home as a person with confirmed diagnosis of COVID-19 or a PUI (household contact)?    X   Have you been diagnosed with COVID-19?    X              What to do next: Answered NO to all: Answered YES to anything:   Proceed with unit schedule Follow the [facility] Inpatient Flowsheet.

## 2018-09-29 NOTE — Progress Notes (Signed)
DAR NOTE: Pt present with flat affect and depressed mood in the unit. Pt complained of not being prescribed Suboxone, stated he will continue to buy off the street because is cheaper that way. Pt also complained of not being able to sleep, pt given trazodone, but stated that it does not work for him. Pt denies physical pain, took all his meds as scheduled.  Pt's safety ensured with 15 minute and environmental checks. Pt currently denies SI/HI and A/V hallucinations. Pt verbally agrees to seek staff if SI/HI or A/VH occurs and to consult with staff before acting on these thoughts. Will continue POC.

## 2018-09-29 NOTE — Progress Notes (Signed)
Patient ID: Joshua Mcdowell, male   DOB: 06/28/1980, 38 y.o.   MRN: [id number]  Patient stayed in bed for most of the day but did get up this evening and expressed interested in discharging tomorrow. Patient states he needs to reapply for his disability tomorrow or he is afraid he will lose it. Patient denies SI/HI/AVH or withdrawal. Patient seen attending dinner in no distress. Patient denied need for PRN medications today.  Safety maintained with q15 minute checks. Fall prevention precautions in place. Patient medicated as ordered.   Patient in no distress. No behavior issues observed.

## 2018-09-29 NOTE — Progress Notes (Signed)
[facility] MD Progress Note  09/29/2018 9:19 AM Joshua Mcdowell  MRN:  [id number] Subjective:    Patient states he is still not sleeping well he believes the energy booster and augmentation with modafinil was helpful but tends to stay to himself and stay in bed.  Again seems to have an unusual failure to thrive with his depression despite his youth.  Further he elaborated that he been on Suboxone but apparently this was off the street he does not have a prescription for it so we told him this is a form of abuse and we cannot simply continue a drug of abuse for him that he is not prescribed.  Principal Problem: Depression with failure to thrive/substance abuse Diagnosis: Active Problems:   MDD (major depressive disorder)  Total Time spent with patient: 20 minutes    Past Medical History:  Past Medical History:  Diagnosis Date  . Anxiety   . Depression     Past Surgical History:  Procedure Laterality Date  . ORIF ANKLE FRACTURE    . WISDOM TOOTH EXTRACTION     Family History: History reviewed. No pertinent family history. Family Psychiatric  History: no new Social History:  Social History   Substance and Sexual Activity  Alcohol Use Yes     Social History   Substance and Sexual Activity  Drug Use Yes  . Types: Cocaine, Heroin   Comment: narcotics, heroine    Social History   Socioeconomic History  . Marital status: Single    Spouse name: Not on file  . Number of children: Not on file  . Years of education: Not on file  . Highest education level: Not on file  Occupational History  . Not on file  Social Needs  . Financial resource strain: Not on file  . Food insecurity    Worry: Not on file    Inability: Not on file  . Transportation needs    Medical: Not on file    Non-medical: Not on file  Tobacco Use  . Smoking status: Current Every Day Smoker  . Smokeless tobacco: Never Used  Substance and Sexual Activity  . Alcohol use: Yes  . Drug use: Yes    Types: Cocaine,  Heroin    Comment: narcotics, heroine  . Sexual activity: Not on file  Lifestyle  . Physical activity    Days per week: Not on file    Minutes per session: Not on file  . Stress: Not on file  Relationships  . Social Herbalist on phone: Not on file    Gets together: Not on file    Attends religious service: Not on file    Active member of club or organization: Not on file    Attends meetings of clubs or organizations: Not on file    Relationship status: Not on file  Other Topics Concern  . Not on file  Social History Narrative  . Not on file   Additional Social History:    Pain Medications: See MAR Prescriptions: See MAR Over the Counter: See MAR History of alcohol / drug use?: Yes Negative Consequences of Use: Financial, Personal relationships Withdrawal Symptoms: Other (Comment)(none)                    Sleep: Poor  Appetite:  Good  Current Medications: Current Facility-Administered Medications  Medication Dose Route Frequency Provider Last Rate Last Dose  . acetaminophen (TYLENOL) tablet 650 mg  650 mg Oral Q6H PRN Mordecai Maes,  NP   650 mg at 09/28/18 1829  . alum & mag hydroxide-simeth (MAALOX/MYLANTA) 200-200-20 MG/5ML suspension 30 mL  30 mL Oral Q4H PRN Denzil Magnusonhomas, Lashunda, NP      . FLUoxetine (PROZAC) capsule 20 mg  20 mg Oral Daily Malvin JohnsFarah, Akim Watkinson, MD   20 mg at 09/29/18 0827  . hydrOXYzine (ATARAX/VISTARIL) tablet 25 mg  25 mg Oral Q6H PRN Nira ConnBerry, Jason A, NP   25 mg at 09/28/18 1829  . mirtazapine (REMERON SOL-TAB) disintegrating tablet 15 mg  15 mg Oral QHS Malvin JohnsFarah, Kenyan Karnes, MD   15 mg at 09/28/18 2049  . modafinil (PROVIGIL) tablet 100 mg  100 mg Oral Daily Malvin JohnsFarah, Ricki Vanhandel, MD   100 mg at 09/29/18 0826  . prenatal multivitamin tablet 1 tablet  1 tablet Oral Daily Malvin JohnsFarah, Sun Kihn, MD   1 tablet at 09/29/18 0827  . traZODone (DESYREL) tablet 50 mg  50 mg Oral QHS PRN Nira ConnBerry, Jason A, NP   50 mg at 09/28/18 2052    Lab Results: No results found for this  or any previous visit (from the past 48 hour(s)).  Blood Alcohol level:  Lab Results  Component Value Date   ETH <10 09/23/2018   ETH <5 04/07/2015    Metabolic Disorder Labs: No results found for: HGBA1C, MPG No results found for: PROLACTIN No results found for: CHOL, TRIG, HDL, CHOLHDL, VLDL, LDLCALC  Physical Findings: AIMS: Facial and Oral Movements Muscles of Facial Expression: None, normal Lips and Perioral Area: None, normal Jaw: None, normal Tongue: None, normal,Extremity Movements Upper (arms, wrists, hands, fingers): None, normal Lower (legs, knees, ankles, toes): None, normal, Trunk Movements Neck, shoulders, hips: None, normal, Overall Severity Severity of abnormal movements (highest score from questions above): None, normal Incapacitation due to abnormal movements: None, normal Patient's awareness of abnormal movements (rate only patient's report): No Awareness, Dental Status Current problems with teeth and/or dentures?: No Does patient usually wear dentures?: No  CIWA:  CIWA-Ar Total: 2 COWS:  COWS Total Score: 3  Musculoskeletal: Strength & Muscle Tone: within normal limits Gait & Station: normal Patient leans: N/A  Psychiatric Specialty Exam: Physical Exam  ROS  Blood pressure 114/70, pulse (!) 103, temperature 99.1 F (37.3 C), temperature source Oral, resp. rate 18, height 5' 10.5" (1.791 m), weight 72.6 kg, SpO2 100 %.Body mass index is 22.63 kg/m.  General Appearance: Casual  Eye Contact:  Minimal  Speech:  Slow  Volume:  Decreased  Mood:  Depressed  Affect:  Flat  Thought Process:  Coherent and Descriptions of Associations: Intact  Orientation:  Full (Time, Place, and Person)  Thought Content:  Logical  Suicidal Thoughts:  No but passive due to neglect  Homicidal Thoughts:  No  Memory:  Immediate;   Fair  Judgement:  Fair  Insight:  Fair  Psychomotor Activity:  Normal  Concentration:  Concentration: Fair  Recall:  [employer]  Fund of  Knowledge:  Fair  Language:  paucity  Akathisia:  Negative  Handed:  Right  AIMS (if indicated):     Assets:  Communication Skills Desire for Improvement  ADL's:  Intact  Cognition:  WNL  Sleep:  Number of Hours: 1.75     Treatment Plan Summary: Daily contact with patient to assess and evaluate symptoms and progress in treatment, Medication management and Plan Add sleep aid/mirtazapine continue cognitive therapy continue modafinil augmentation continue antidepressant therapy.  Continue current precautions  Reynalda Canny, MD 09/29/2018, 9:19 AM

## 2018-09-30 MED ORDER — PRENATAL MULTIVITAMIN CH: 1.0000 | ORAL_TABLET | Freq: Every day | ORAL | 2 refills | Status: DC

## 2018-09-30 MED ORDER — TRAZODONE HCL 300 MG PO TABS: 300.0000 mg | ORAL_TABLET | Freq: Every evening | ORAL | 1 refills | Status: DC | PRN

## 2018-09-30 MED ORDER — FLUOXETINE HCL 40 MG PO CAPS: 40.0000 mg | ORAL_CAPSULE | Freq: Every day | ORAL | 1 refills | Status: DC

## 2018-09-30 MED ORDER — FLUOXETINE HCL 20 MG PO CAPS
40.0000 mg | ORAL_CAPSULE | Freq: Every day | ORAL | Status: DC
  Filled 2018-09-30: qty 2

## 2018-09-30 MED ORDER — MIRTAZAPINE 30 MG PO TBDP: 30.0000 mg | ORAL_TABLET | Freq: Every day | ORAL | 1 refills | Status: DC

## 2018-09-30 NOTE — Progress Notes (Signed)
[place] NOVEL CORONAVIRUS (COVID-19) DAILY CHECK-OFF SYMPTOMS - answer yes or no to each - every day NO YES  Have you had a fever in the past 24 hours?  . Fever (Temp > 37.80C / 100F) X   Have you had any of these symptoms in the past 24 hours? . New Cough .  Sore Throat  .  Shortness of Breath .  Difficulty Breathing .  Unexplained Body Aches   X   Have you had any one of these symptoms in the past 24 hours not related to allergies?   . Runny Nose .  Nasal Congestion .  Sneezing   X   If you have had runny nose, nasal congestion, sneezing in the past 24 hours, has it worsened?  X   EXPOSURES - check yes or no X   Have you traveled outside the state in the past 14 days?  X   Have you been in contact with someone with a confirmed diagnosis of COVID-19 or PUI in the past 14 days without wearing appropriate PPE?  X   Have you been living in the same home as a person with confirmed diagnosis of COVID-19 or a PUI (household contact)?    X   Have you been diagnosed with COVID-19?    X              What to do next: Answered NO to all: Answered YES to anything:   Proceed with unit schedule Follow the [facility] Inpatient Flowsheet.   

## 2018-09-30 NOTE — Progress Notes (Signed)
Patient was very restless through the night and he thinks it was the trazodone.

## 2018-09-30 NOTE — [facility] Suicide Risk Assessment (Signed)
[facility] Discharge Suicide Risk Assessment   Principal Problem: MDD Discharge Diagnoses: Active Problems:   MDD (major depressive disorder)   Total Time spent with patient: 45 minutes  Musculoskeletal: Strength & Muscle Tone: within normal limits Gait & Station: normal Patient leans: N/A  Psychiatric Specialty Exam: ROS  Blood pressure 137/83, pulse 74, temperature 99.1 F (37.3 C), temperature source Oral, resp. rate 18, height 5' 10.5" (1.791 m), weight 72.6 kg, SpO2 100 %.Body mass index is 22.63 kg/m.  General Appearance: Casual  Eye Contact::  Good  Speech:  Clear and Coherent409  Volume:  Normal  Mood:  Dysphoric  Affect:  Congruent  Thought Process:  Coherent  Orientation:  Full (Time, Place, and Person)  Thought Content:  Rumination  Suicidal Thoughts:  No  Homicidal Thoughts:  No  Memory:  good  Judgement:  Intact  Insight:  Good  Psychomotor Activity:  Normal  Concentration:  Good  Recall:  Good  Fund of Knowledge:Good  Language: Good  Akathisia:  Negative  Handed:  Right  AIMS (if indicated):     Assets:  Communication Skills Desire for Improvement Leisure Time Physical Health  Sleep:  Number of Hours: 3  Cognition: WNL  ADL's:  Intact   Mental Status Per Nursing Assessment::   On Admission:  NA  Demographic Factors:  Unemployed  Loss Factors: Decrease in vocational status  Historical Factors: NA  Risk Reduction Factors:   Sense of responsibility to family and Religious beliefs about death  Continued Clinical Symptoms:  Dysthymia  Cognitive Features That Contribute To Risk:  None    Suicide Risk:  Minimal: No identifiable suicidal ideation.  Patients presenting with no risk factors but with morbid ruminations; may be classified as minimal risk based on the severity of the depressive symptoms    Plan Of Care/Follow-up recommendations:  Activity:  full  Joshua Devincentis, MD 09/30/2018, 10:13 AM

## 2018-09-30 NOTE — Progress Notes (Signed)
Recreation Therapy Notes  Date:  7.17.20 Time: 0930 Location: [address] Dayroom  Group Topic: Stress Management  Goal Area(s) Addresses:  Patient will identify positive stress management techniques. Patient will identify benefits of using stress management post d/c.  Behavioral Response:  Engaged  Intervention: Stress Management  Activity : Guided Imagery.  LRT introduced the stress management technique of guided imagery.  LRT read Mcdowell script that focused on imagining your peaceful place.  Patients were to listen and follow along as script was read to participate in activity.  Education:  Stress Management, Discharge Planning.   Education Outcome: Acknowledges Education  Clinical Observations/Feedback: Pt attended and participated in group.      Joshua Mcdowell, LRT/CTRS         Joshua Mcdowell 09/30/2018 10:37 AM 

## 2018-09-30 NOTE — Progress Notes (Signed)
  [facility] Adult Case Management Discharge Plan :  Will you be returning to the same living situation after discharge:  Yes,  home At discharge, do you have transportation home?: Yes,  [employer] at 1:00pm Do you have the ability to pay for your medications: No.; [facility]  Release of information consent forms completed and in the chart;  Patient's signature needed at discharge.  Patient to Follow up at: Follow-up Information    [facility] Follow up on 10/04/2018.   Why: Teleophonic hospital follow up appointment is Tueday, 7/21 at 2:00p.  The provider will contact you.  Contact information: [address] [place] [id number] [phone]           Next level of care provider has access to [place] and Suicide Prevention discussed: Yes,  pt denied; with pt  Have you used any form of tobacco in the last 30 days? (Cigarettes, Smokeless Tobacco, Cigars, and/or Pipes): No  Has patient been referred to the Quitline?: N/A patient is not a smoker  Patient has been referred for addiction treatment: Yes  Trecia Rogers, LCSW 09/30/2018, 11:13 AM

## 2018-09-30 NOTE — Tx Team (Signed)
Interdisciplinary Treatment and Diagnostic Plan Update  09/30/2018 Time of Session: 08:32am Joshua Mcdowell MRN: [id number]  Principal Diagnosis: <principal problem not specified>  Secondary Diagnoses: Active Problems:   MDD (major depressive disorder)   Current Medications:  Current Facility-Administered Medications  Medication Dose Route Frequency Provider Last Rate Last Dose  . acetaminophen (TYLENOL) tablet 650 mg  650 mg Oral Q6H PRN Mordecai Maes, NP   650 mg at 09/30/18 0904  . alum & mag hydroxide-simeth (MAALOX/MYLANTA) 200-200-20 MG/5ML suspension 30 mL  30 mL Oral Q4H PRN Mordecai Maes, NP      . FLUoxetine (PROZAC) capsule 20 mg  20 mg Oral Daily Johnn Hai, MD   20 mg at 09/30/18 0904  . hydrOXYzine (ATARAX/VISTARIL) tablet 25 mg  25 mg Oral Q6H PRN Lindon Romp A, NP   25 mg at 09/30/18 0904  . mirtazapine (REMERON SOL-TAB) disintegrating tablet 30 mg  30 mg Oral QHS Johnn Hai, MD   30 mg at 09/29/18 2156  . modafinil (PROVIGIL) tablet 200 mg  200 mg Oral Daily Johnn Hai, MD   200 mg at 09/30/18 2423  . prenatal multivitamin tablet 1 tablet  1 tablet Oral Daily Johnn Hai, MD   1 tablet at 09/30/18 0904  . traZODone (DESYREL) tablet 300 mg  300 mg Oral QHS PRN Johnn Hai, MD   300 mg at 09/29/18 2155   PTA Medications: Medications Prior to Admission  Medication Sig Dispense Refill Last Dose  . cloNIDine (CATAPRES) 0.1 MG tablet Take 1 tablet (0.1 mg total) by mouth daily for 2 days. 2 tablet 0   . sertraline (ZOLOFT) 25 MG tablet Take 1 tablet (25 mg total) by mouth daily. 30 tablet 2     Patient Stressors: Financial difficulties Substance abuse Traumatic event  Patient Strengths: Average or above average intelligence Communication skills Work skills  Treatment Modalities: Medication Management, Group therapy, Case management,  1 to 1 session with clinician, Psychoeducation, Recreational therapy.   Physician Treatment Plan for Primary  Diagnosis: <principal problem not specified> Long Term Goal(s): Improvement in symptoms so as ready for discharge Improvement in symptoms so as ready for discharge   Short Term Goals: Ability to verbalize feelings will improve Ability to disclose and discuss suicidal ideas Ability to maintain clinical measurements within normal limits will improve Ability to demonstrate self-control will improve Ability to identify and develop effective coping behaviors will improve Ability to maintain clinical measurements within normal limits will improve Compliance with prescribed medications will improve  Medication Management: Evaluate patient's response, side effects, and tolerance of medication regimen.  Therapeutic Interventions: 1 to 1 sessions, Unit Group sessions and Medication administration.  Evaluation of Outcomes: Adequate for Discharge  Physician Treatment Plan for Secondary Diagnosis: Active Problems:   MDD (major depressive disorder)  Long Term Goal(s): Improvement in symptoms so as ready for discharge Improvement in symptoms so as ready for discharge   Short Term Goals: Ability to verbalize feelings will improve Ability to disclose and discuss suicidal ideas Ability to maintain clinical measurements within normal limits will improve Ability to demonstrate self-control will improve Ability to identify and develop effective coping behaviors will improve Ability to maintain clinical measurements within normal limits will improve Compliance with prescribed medications will improve     Medication Management: Evaluate patient's response, side effects, and tolerance of medication regimen.  Therapeutic Interventions: 1 to 1 sessions, Unit Group sessions and Medication administration.  Evaluation of Outcomes: Adequate for Discharge   RN Treatment Plan for Primary  Diagnosis: <principal problem not specified> Long Term Goal(s): Knowledge of disease and therapeutic regimen to maintain  health will improve  Short Term Goals: Ability to participate in decision making will improve, Ability to verbalize feelings will improve, Ability to disclose and discuss suicidal ideas, Ability to identify and develop effective coping behaviors will improve and Compliance with prescribed medications will improve  Medication Management: RN will administer medications as ordered by provider, will assess and evaluate patient's response and provide education to patient for prescribed medication. RN will report any adverse and/or side effects to prescribing provider.  Therapeutic Interventions: 1 on 1 counseling sessions, Psychoeducation, Medication administration, Evaluate responses to treatment, Monitor vital signs and CBGs as ordered, Perform/monitor CIWA, COWS, AIMS and Fall Risk screenings as ordered, Perform wound care treatments as ordered.  Evaluation of Outcomes: Adequate for Discharge   LCSW Treatment Plan for Primary Diagnosis: <principal problem not specified> Long Term Goal(s): Safe transition to appropriate next level of care at discharge, Engage patient in therapeutic group addressing interpersonal concerns.  Short Term Goals: Engage patient in aftercare planning with referrals and resources and Increase skills for wellness and recovery  Therapeutic Interventions: Assess for all discharge needs, 1 to 1 time with Social worker, Explore available resources and support systems, Assess for adequacy in community support network, Educate family and significant other(s) on suicide prevention, Complete Psychosocial Assessment, Interpersonal group therapy.  Evaluation of Outcomes: Adequate for Discharge   Progress in Treatment: Attending groups: No. Participating in groups: No. Taking medication as prescribed: Yes. Toleration medication: Yes. Family/Significant other contact made: Yes, individual(s) contacted:  with pt Patient understands diagnosis: Yes. Discussing patient identified  problems/goals with staff: Yes. Medical problems stabilized or resolved: Yes. Denies suicidal/homicidal ideation: Yes. Issues/concerns per patient self-inventory: No. Other:   New problem(s) identified: No, Describe:  None  New Short Term/Long Term Goal(s): Medication stabilization, elimination of SI thoughts, and development of a comprehensive mental wellness plan.   Patient Goals:    Discharge Plan or Barriers: [facility]. Discharging today.  Reason for Continuation of Hospitalization: Patient is discharging today.   Estimated Length of Stay: Patient is discharging today.    Attendees: Patient: 09/30/2018   Physician: Dr. Landry MellowGreg Clary, RN 09/30/2018   Nursing: Alexia FreestonePatty, RN 09/30/2018   RN Care Manager: 09/30/2018   Social Worker: Stephannie PetersJasmine Tatayana Mcdowell, CSW 09/30/2018   Recreational Therapist:  09/30/2018   Other:  09/30/2018   Other:  09/30/2018   Other: 09/30/2018     Scribe for Treatment Team: Delphia GratesJasmine M Melena Hayes, LCSW 09/30/2018 11:11 AM

## 2018-09-30 NOTE — Progress Notes (Signed)
Patient ID: Joshua Mcdowell, male   DOB: 09/10/1980, 38 y.o.   MRN: [id number]   CSW scheduled [place] ride for patient for 1pm. CSW notified nurse.

## 2018-09-30 NOTE — Discharge Summary (Signed)
Physician Discharge Summary Note  Patient:  Joshua Mcdowell is an 38 y.o., male MRN:  [id number] DOB:  05/09/1980 Patient phone:  [phone] (home)  Patient address:   [address] [place] [address],  Total Time spent with patient: 45 minutes  Date of Admission:  09/27/2018 Date of Discharge: 09/30/2018  Reason for Admission:   Patient required admission due to the severity of his depressive symptoms and despite his young age he actually had a good failure to thrive diagnosis he was lying in bed defecating on himself not taking care of himself possibly abusing opiates and again required petition for involuntary commitment.  Principal Problem: Passive self-destruction in the context of severe depression with failure to thrive recent losses discussed Discharge Diagnoses: Active Problems:   MDD (major depressive disorder)   Past Psychiatric History: Substance abuse  Past Medical History:  Past Medical History:  Diagnosis Date  . Anxiety   . Depression     Past Surgical History:  Procedure Laterality Date  . ORIF ANKLE FRACTURE    . WISDOM TOOTH EXTRACTION     Family History: History reviewed. No pertinent family history. Family Psychiatric  History: MDD/schiz Social History:  Social History   Substance and Sexual Activity  Alcohol Use Yes     Social History   Substance and Sexual Activity  Drug Use Yes  . Types: Cocaine, Heroin   Comment: narcotics, heroine    Social History   Socioeconomic History  . Marital status: Single    Spouse name: Not on file  . Number of children: Not on file  . Years of education: Not on file  . Highest education level: Not on file  Occupational History  . Not on file  Social Needs  . Financial resource strain: Not on file  . Food insecurity    Worry: Not on file    Inability: Not on file  . Transportation needs    Medical: Not on file    Non-medical: Not on file  Tobacco Use  . Smoking status: Current Every Day Smoker  .  Smokeless tobacco: Never Used  Substance and Sexual Activity  . Alcohol use: Yes  . Drug use: Yes    Types: Cocaine, Heroin    Comment: narcotics, heroine  . Sexual activity: Not on file  Lifestyle  . Physical activity    Days per week: Not on file    Minutes per session: Not on file  . Stress: Not on file  Relationships  . Social Musicianconnections    Talks on phone: Not on file    Gets together: Not on file    Attends religious service: Not on file    Active member of club or organization: Not on file    Attends meetings of clubs or organizations: Not on file    Relationship status: Not on file  Other Topics Concern  . Not on file  Social History Narrative  . Not on file    Hospital Course:   Patient did very well here he engaged in cognitive therapy he eventually got out of bed and he began participating he denied suicidal thoughts plans or intent by the date of discharge his mood actually improved little bit although he is not fully euthymic.  At any rate he had no psychosis, no thoughts of harming himself and insisted upon discharge so he can follow-up for unemployment and receive benefits and his deadline was this afternoon.  Physical Findings: AIMS: Facial and Oral Movements Muscles of  Facial Expression: None, normal Lips and Perioral Area: None, normal Jaw: None, normal Tongue: None, normal,Extremity Movements Upper (arms, wrists, hands, fingers): None, normal Lower (legs, knees, ankles, toes): None, normal, Trunk Movements Neck, shoulders, hips: None, normal, Overall Severity Severity of abnormal movements (highest score from questions above): None, normal Incapacitation due to abnormal movements: None, normal Patient's awareness of abnormal movements (rate only patient's report): No Awareness, Dental Status Current problems with teeth and/or dentures?: No Does patient usually wear dentures?: No  CIWA:  CIWA-Ar Total: 2 COWS:  COWS Total Score:  3  Musculoskeletal: Strength & Muscle Tone: within normal limits Gait & Station: normal Patient leans: N/A  Psychiatric Specialty Exam: ROS  Blood pressure 137/83, pulse 74, temperature 99.1 F (37.3 C), temperature source Oral, resp. rate 18, height 5' 10.5" (1.791 m), weight 72.6 kg, SpO2 100 %.Body mass index is 22.63 kg/m.  General Appearance: Casual  Eye Contact::  Good  Speech:  Clear and Coherent409  Volume:  Normal  Mood:  Dysphoric  Affect:  Congruent  Thought Process:  Coherent  Orientation:  Full (Time, Place, and Person)  Thought Content:  Rumination  Suicidal Thoughts:  No  Homicidal Thoughts:  No  Memory:  good  Judgement:  Intact  Insight:  Good  Psychomotor Activity:  Normal  Concentration:  Good  Recall:  Good  Fund of Knowledge:Good  Language: Good  Akathisia:  Negative  Handed:  Right  AIMS (if indicated):     Assets:  Communication Skills Desire for Improvement Leisure Time Physical Health  Sleep:  Number of Hours: 3  Cognition: WNL  ADL's:  Intact     Have you used any form of tobacco in the last 30 days? (Cigarettes, Smokeless Tobacco, Cigars, and/or Pipes): No  Has this patient used any form of tobacco in the last 30 days? (Cigarettes, Smokeless Tobacco, Cigars, and/or Pipes) Yes, No  Blood Alcohol level:  Lab Results  Component Value Date   ETH <10 09/23/2018   ETH <5 04/07/2015    Metabolic Disorder Labs:  No results found for: HGBA1C, MPG No results found for: PROLACTIN No results found for: CHOL, TRIG, HDL, CHOLHDL, VLDL, LDLCALC  See Psychiatric Specialty Exam and Suicide Risk Assessment completed by Attending Physician prior to discharge.  Discharge destination:  Home  Is patient on multiple antipsychotic therapies at discharge:  No   Has Patient had three or more failed trials of antipsychotic monotherapy by history:  No  Recommended Plan for Multiple Antipsychotic Therapies: NA   Allergies as of 09/30/2018   No  Known Allergies     Medication List    STOP taking these medications   cloNIDine 0.1 MG tablet Commonly known as: CATAPRES   sertraline 25 MG tablet Commonly known as: Zoloft     TAKE these medications     Indication  FLUoxetine 40 MG capsule Commonly known as: PROZAC Take 1 capsule (40 mg total) by mouth daily. Start taking on: October 01, 2018  Indication: Depression   mirtazapine 30 MG disintegrating tablet Commonly known as: REMERON SOL-TAB Take 1 tablet (30 mg total) by mouth at bedtime.  Indication: Major Depressive Disorder   prenatal multivitamin Tabs tablet Take 1 tablet by mouth daily. Start taking on: October 01, 2018  Indication: Vitamin Deficiency   trazodone 300 MG tablet Commonly known as: DESYREL Take 1 tablet (300 mg total) by mouth at bedtime as needed for sleep.  Indication: Trouble Sleeping      Follow-up Information  [facility] Follow up on 10/04/2018.   Why: Teleophonic hospital follow up appointment is Tueday, 7/21 at 2:00p.  The provider will contact you.  Contact information: [address] [place] [phone]           SignedJohnn Hai, MD 09/30/2018, 12:47 PM

## 2018-09-30 NOTE — Progress Notes (Signed)
MD completed discharge order in patient's chart. Pt completed his daily assessment and on this he wrote he denied having SI today and he rated his depression, hopelessness and anxiety " 07/18/08", respectively. He is given his dc instructions by this Probation officer. He stated verbal understanding and willingness to comply . He is given cc of this material ( AVS, SSP , transition record and SRA). He is escorted to bldg entrance and dc'd per MD order.

## 2019-03-16 ENCOUNTER — Encounter (HOSPITAL_COMMUNITY): Payer: Self-pay

## 2019-03-16 ENCOUNTER — Emergency Department (HOSPITAL_COMMUNITY)
Admission: EM | Admit: 2019-03-16 | Discharge: 2019-03-17 | Disposition: A | Discharge status: Home / Self Care | Payer: Self-pay | Attending: Emergency Medicine | Admitting: Emergency Medicine

## 2019-03-16 ENCOUNTER — Other Ambulatory Visit: Payer: Self-pay

## 2019-03-16 DIAGNOSIS — F10920 Alcohol use, unspecified with intoxication, uncomplicated: Secondary | ICD-10-CM

## 2019-03-16 DIAGNOSIS — F17210 Nicotine dependence, cigarettes, uncomplicated: Secondary | ICD-10-CM | POA: Insufficient documentation

## 2019-03-16 DIAGNOSIS — F10929 Alcohol use, unspecified with intoxication, unspecified: Secondary | ICD-10-CM | POA: Insufficient documentation

## 2019-03-16 NOTE — ED Provider Notes (Signed)
[facility] DEPT Provider Note   CSN: [id number] Arrival date & time: 03/16/19  2055     History Chief Complaint  Patient presents with  . Alcohol Intoxication    Joshua Mcdowell is a 39 y.o. male37.  38 yo M with a chief complaints of having too much to drink.  Patient states that him drinking heavily today.  Denies any coingestants.  Denies any illegal drugs denies any pills.  He denies chest pain shortness of breath abdominal pain vomiting.  The history is provided by the patient.  Alcohol Intoxication Pertinent negatives include no chest pain, no abdominal pain, no headaches and no shortness of breath.  Illness Severity:  Moderate Onset quality:  Sudden Duration:  6 hours Timing:  Constant Progression:  Unchanged Chronicity:  New Associated symptoms: no abdominal pain, no chest pain, no congestion, no diarrhea, no fever, no headaches, no myalgias, no rash, no shortness of breath and no vomiting        Past Medical History:  Diagnosis Date  . Anxiety   . Depression     Patient Active Problem List   Diagnosis Date Noted  . MDD (major depressive disorder) 09/27/2018  . Altered mental status   . Opioid withdrawal ([place])   . Hypokalemia   . Hypernatremia   . Tobacco abuse 09/24/2018  . Alcohol use 09/24/2018  . Opioid dependence with opioid-induced mood disorder ([place]) 09/24/2018  . Major depressive disorder, recurrent episode, moderate ([place]) 09/24/2018  . Depression   . Anxiety   . Acute metabolic encephalopathy   . AKI (acute kidney injury) ([place])   . Leukocytosis   . Polysubstance abuse ([place])   . Dehydration     Past Surgical History:  Procedure Laterality Date  . ORIF ANKLE FRACTURE    . WISDOM TOOTH EXTRACTION         History reviewed. No pertinent family history.  Social History   Tobacco Use  . Smoking status: Current Every Day Smoker  . Smokeless tobacco: Never Used  Substance Use Topics  . Alcohol use: Yes    . Drug use: Yes    Types: Cocaine, Heroin    Comment: narcotics, heroine    Home Medications Prior to Admission medications   Medication Sig Start Date End Date Taking? Authorizing Provider  FLUoxetine (PROZAC) 40 MG capsule Take 1 capsule (40 mg total) by mouth daily. 10/01/18   Johnn Hai, MD  mirtazapine (REMERON SOL-TAB) 30 MG disintegrating tablet Take 1 tablet (30 mg total) by mouth at bedtime. 09/30/18   Johnn Hai, MD  Prenatal Vit-Fe Fumarate-FA (PRENATAL MULTIVITAMIN) TABS tablet Take 1 tablet by mouth daily. 10/01/18   Johnn Hai, MD  traZODone (DESYREL) 300 MG tablet Take 1 tablet (300 mg total) by mouth at bedtime as needed for sleep. 09/30/18   Johnn Hai, MD    Allergies    Patient has no known allergies.  Review of Systems   Review of Systems  Constitutional: Negative for chills and fever.  HENT: Negative for congestion and facial swelling.   Eyes: Negative for discharge and visual disturbance.  Respiratory: Negative for shortness of breath.   Cardiovascular: Negative for chest pain and palpitations.  Gastrointestinal: Negative for abdominal pain, diarrhea and vomiting.  Musculoskeletal: Negative for arthralgias and myalgias.  Skin: Negative for color change and rash.  Neurological: Negative for tremors, syncope and headaches.  Psychiatric/Behavioral: Negative for confusion and dysphoric mood.    Physical Exam Updated Vital Signs BP 129/74   Pulse  72   Temp 97.7 F (36.5 C) (Oral)   Resp 18   SpO2 90%   Physical Exam  ED Results / Procedures / Treatments   Labs (all labs ordered are listed, but only abnormal results are displayed) Labs Reviewed - No data to display  EKG None  Radiology No results found.  Procedures Procedures (including critical care time)  Medications Ordered in ED Medications - No data to display  ED Course  I have reviewed the triage vital signs and the nursing notes.  Pertinent labs & imaging results that were  available during my care of the patient were reviewed by me and considered in my medical decision making (see chart for details).    MDM Rules/Calculators/A&P                      38 yo M with a cc of etoh intoxication.  Denies other complaint.  Too intoxicated to stay awake and converse with me.  Will obs in the ED.   Signed out to Dr. Eudelia Bunch, please see their note for further details of care in the ED.   The patients results and plan were reviewed and discussed.   Any x-rays performed were independently reviewed by myself.   Differential diagnosis were considered with the presenting HPI.  Medications - No data to display  Vitals:   03/16/19 2107 03/16/19 2111 03/16/19 2115 03/16/19 2133  BP: (!) 143/74 122/89 129/74 129/74  Pulse: (!) 55 72 71 72  Resp: 16 18    Temp: 97.7 F (36.5 C)     TempSrc: Oral     SpO2: 93% 98% 90%     Final diagnoses:  Alcoholic intoxication without complication (HCC)    Admission/ observation were discussed with the admitting physician, patient and/or family and they are comfortable with the plan.   Final Clinical Impression(s) / ED Diagnoses Final diagnoses:  Alcoholic intoxication without complication [facility])    Rx / DC Orders ED Discharge Orders    None       Melene Plan, DO 03/16/19 2311

## 2019-03-16 NOTE — ED Triage Notes (Signed)
Pt [facility] from [facility]. Pt intoxicated, denies drug use. 18 LAC. Per EMS, he desats when falling asleep. 12 lead unremarkable. CBG 121.

## 2019-03-16 NOTE — ED Notes (Signed)
[place] (girlfriend) [phone]

## 2019-03-16 NOTE — ED Notes (Signed)
Patient crying at bedside, states a lot of his family has died recently

## 2019-03-17 NOTE — ED Provider Notes (Signed)
I assumed care of this patient from Dr. Adela Lank.  Please see their note for further details of Hx, PE.  Briefly patient is a 39 y.o. male who presented intoxication. Plan for MTF.  Patient clinically sober ambulating without complication.  The patient appears reasonably screened and/or stabilized for discharge and I doubt any other medical condition or other [facility] requiring further screening, evaluation, or treatment in the ED at this time prior to discharge.  The patient is safe for discharge with strict return precautions.   The patient appears reasonably screened and/or stabilized for discharge and I doubt any other medical condition or other [facility] requiring further screening, evaluation, or treatment in the ED at this time prior to discharge.  Disposition: Discharge  Condition: Good  I have discussed the results, Dx and Tx plan with the patient who expressed understanding and agree(s) with the plan. Discharge instructions discussed at great length. The patient was given strict return precautions who verbalized understanding of the instructions. No further questions at time of discharge.    ED Discharge Orders    None       Follow Up: Primary care provider  Call  As needed         Aleiah Mohammed, Amadeo Garnet, MD 03/17/19 [phone]

## 2019-04-21 ENCOUNTER — Emergency Department (HOSPITAL_COMMUNITY): Payer: Self-pay

## 2019-04-21 ENCOUNTER — Emergency Department (HOSPITAL_COMMUNITY)
Admission: EM | Admit: 2019-04-21 | Discharge: 2019-04-21 | Disposition: A | Discharge status: Home / Self Care | Payer: Self-pay | Attending: Emergency Medicine | Admitting: Emergency Medicine

## 2019-04-21 ENCOUNTER — Other Ambulatory Visit: Payer: Self-pay

## 2019-04-21 ENCOUNTER — Encounter (HOSPITAL_COMMUNITY): Payer: Self-pay | Admitting: Emergency Medicine

## 2019-04-21 DIAGNOSIS — R350 Frequency of micturition: Secondary | ICD-10-CM | POA: Insufficient documentation

## 2019-04-21 DIAGNOSIS — F1410 Cocaine abuse, uncomplicated: Secondary | ICD-10-CM | POA: Insufficient documentation

## 2019-04-21 DIAGNOSIS — Z79899 Other long term (current) drug therapy: Secondary | ICD-10-CM | POA: Insufficient documentation

## 2019-04-21 DIAGNOSIS — N200 Calculus of kidney: Secondary | ICD-10-CM | POA: Insufficient documentation

## 2019-04-21 DIAGNOSIS — F17200 Nicotine dependence, unspecified, uncomplicated: Secondary | ICD-10-CM | POA: Insufficient documentation

## 2019-04-21 DIAGNOSIS — F1110 Opioid abuse, uncomplicated: Secondary | ICD-10-CM | POA: Insufficient documentation

## 2019-04-21 DIAGNOSIS — R300 Dysuria: Secondary | ICD-10-CM | POA: Insufficient documentation

## 2019-04-21 LAB — URINALYSIS, ROUTINE W REFLEX MICROSCOPIC
Bilirubin Urine: NEGATIVE
Glucose, UA: NEGATIVE mg/dL
Hgb urine dipstick: NEGATIVE
Ketones, ur: NEGATIVE mg/dL
Leukocytes,Ua: NEGATIVE
Nitrite: NEGATIVE
Protein, ur: NEGATIVE mg/dL
Specific Gravity, Urine: 1.017 (ref 1.005–1.030)
pH: 5 (ref 5.0–8.0)

## 2019-04-21 LAB — CBC
HCT: 44.3 % (ref 39.0–52.0)
Hemoglobin: 15.2 g/dL (ref 13.0–17.0)
MCH: 30.5 pg (ref 26.0–34.0)
MCHC: 34.3 g/dL (ref 30.0–36.0)
MCV: 89 fL (ref 80.0–100.0)
Platelets: 218 10*3/uL (ref 150–400)
RBC: 4.98 MIL/uL (ref 4.22–5.81)
RDW: 12.3 % (ref 11.5–15.5)
WBC: 4.7 10*3/uL (ref 4.0–10.5)
nRBC: 0 % (ref 0.0–0.2)

## 2019-04-21 LAB — BASIC METABOLIC PANEL
Anion gap: 8 (ref 5–15)
BUN: 10 mg/dL (ref 6–20)
CO2: 26 mmol/L (ref 22–32)
Calcium: 9.6 mg/dL (ref 8.9–10.3)
Chloride: 108 mmol/L (ref 98–111)
Creatinine, Ser: 0.98 mg/dL (ref 0.61–1.24)
GFR calc Af Amer: >60 mL/min (ref >60)
GFR calc non Af Amer: >60 mL/min (ref >60)
Glucose, Bld: 91 mg/dL (ref 70–99)
Potassium: 4.3 mmol/L (ref 3.5–5.1)
Sodium: 142 mmol/L (ref 135–145)

## 2019-04-21 MED ORDER — NAPROXEN 500 MG PO TABS: 500.0000 mg | ORAL_TABLET | Freq: Two times a day (BID) | ORAL | 0 refills | Status: DC

## 2019-04-21 MED ORDER — OXYCODONE-ACETAMINOPHEN 5-325 MG PO TABS
1.0000 | ORAL_TABLET | Freq: Once | ORAL | Status: AC
  Administered 2019-04-21: 14:00:00 1 via ORAL
  Filled 2019-04-21: qty 1

## 2019-04-21 MED ORDER — TAMSULOSIN HCL 0.4 MG PO CAPS: 0.4000 mg | ORAL_CAPSULE | Freq: Every day | ORAL | 0 refills | Status: AC

## 2019-04-21 MED ORDER — IBUPROFEN 400 MG PO TABS
600.0000 mg | ORAL_TABLET | Freq: Once | ORAL | Status: AC
  Administered 2019-04-21: 600 mg via ORAL
  Filled 2019-04-21: qty 1

## 2019-04-21 NOTE — ED Triage Notes (Signed)
Pt here with c/o left flank pain off and on times 3 weeks , some slight burning once when he urinated none lately history of kidney stones

## 2019-04-21 NOTE — Discharge Instructions (Addendum)
As discussed, your CT scan showed multiple tiny bilateral nonobstructing kidney stones.  The stone should pass on their own.  Continue to drink a lot of fluids.  I am sending you home with a pain medication called naproxen.  You can take it twice a day as needed for pain.  Do not mix with other over-the-counter medications.  I am also sending home with a medication that sometimes helps pass kidney stones a little quicker which is called Flomax.  You may take it once a day for 1 week.  I have included the number of the urologist.  Call to schedule an appointment if your symptoms do not improve within the next week.  Return to the ER for new or worsening symptoms.

## 2019-04-21 NOTE — ED Provider Notes (Signed)
[facility] EMERGENCY DEPARTMENT Provider Note   CSN: [id number] Arrival date & time: 04/21/19  1256     History No chief complaint on file.   Joshua Mcdowell is a 39 y.o. male with a past medical history significant for major depressive disorder, tobacco abuse, alcohol abuse, and opioid dependence who presents to the ED due to gradual onset of worsening bilateral mid back pain that radiates to his flank region.  Patient notes pain waxes and wanes throughout the day and is worse with movement.  He notes he had a previous kidney stone roughly 10 to 15 years which he "peed out".  Patient admits to associative dysuria and increased urinary frequency.  She notes he has chronic low back pain due to an accident when he was a child, but states this pain is higher up than normal.  He has tried gabapentin with no relief.  Patient denies saddle paresthesias, bilateral numbness/tingling of the lower extremities, IV drug use, history of cancer, and trauma to the back.  Patient denies fever, chills, nausea, and vomiting.  Patient denies penile symptoms.    Past Medical History:  Diagnosis Date   Anxiety    Depression     Patient Active Problem List   Diagnosis Date Noted   MDD (major depressive disorder) 09/27/2018   Altered mental status    Opioid withdrawal (HCC)    Hypokalemia    Hypernatremia    Tobacco abuse 09/24/2018   Alcohol use 09/24/2018   Opioid dependence with opioid-induced mood disorder (HCC) 09/24/2018   Major depressive disorder, recurrent episode, moderate (HCC) 09/24/2018   Depression    Anxiety    Acute metabolic encephalopathy    AKI (acute kidney injury) (HCC)    Leukocytosis    Polysubstance abuse (HCC)    Dehydration     Past Surgical History:  Procedure Laterality Date   ORIF ANKLE FRACTURE     WISDOM TOOTH EXTRACTION         No family history on file.  Social History   Tobacco Use   Smoking status: Current Every  Day Smoker   Smokeless tobacco: Never Used  Substance Use Topics   Alcohol use: Yes   Drug use: Yes    Types: Cocaine, Heroin    Comment: narcotics, heroine    Home Medications Prior to Admission medications   Medication Sig Start Date End Date Taking? Authorizing Provider  FLUoxetine (PROZAC) 40 MG capsule Take 1 capsule (40 mg total) by mouth daily. 10/01/18   Malvin Johns, MD  mirtazapine (REMERON SOL-TAB) 30 MG disintegrating tablet Take 1 tablet (30 mg total) by mouth at bedtime. 09/30/18   Malvin Johns, MD  naproxen (NAPROSYN) 500 MG tablet Take 1 tablet (500 mg total) by mouth 2 (two) times daily. 04/21/19   Mannie Stabile, PA-C  Prenatal Vit-Fe Fumarate-FA (PRENATAL MULTIVITAMIN) TABS tablet Take 1 tablet by mouth daily. 10/01/18   Malvin Johns, MD  tamsulosin (FLOMAX) 0.4 MG CAPS capsule Take 1 capsule (0.4 mg total) by mouth daily for 7 days. 04/21/19 04/28/19  Mannie Stabile, PA-C  traZODone (DESYREL) 300 MG tablet Take 1 tablet (300 mg total) by mouth at bedtime as needed for sleep. 09/30/18   Malvin Johns, MD    Allergies    Patient has no known allergies.  Review of Systems   Review of Systems  Constitutional: Negative for chills and fever.  Gastrointestinal: Positive for abdominal pain. Negative for diarrhea, nausea and vomiting.  Genitourinary: Positive for  decreased urine volume, dysuria and frequency. Negative for penile swelling, scrotal swelling and testicular pain.  Musculoskeletal: Positive for back pain.  Skin: Negative for rash.    Physical Exam Updated Vital Signs BP 114/82    Pulse 94    Temp 98.3 F (36.8 C) (Oral)    Resp 16    Ht 5\' 11"  (1.803 m)    Wt 74.8 kg    SpO2 99%    BMI 23.01 kg/m   Physical Exam Vitals and nursing note reviewed.  Constitutional:      General: He is not in acute distress.    Appearance: He is not ill-appearing.  HENT:     Head: Normocephalic.  Eyes:     Conjunctiva/sclera: Conjunctivae normal.  Cardiovascular:       Rate and Rhythm: Normal rate and regular rhythm.     Pulses: Normal pulses.     Heart sounds: Normal heart sounds. No murmur. No friction rub. No gallop.   Pulmonary:     Effort: Pulmonary effort is normal.     Breath sounds: Normal breath sounds.  Abdominal:     General: Abdomen is flat. Bowel sounds are normal. There is no distension.     Palpations: Abdomen is soft.     Tenderness: There is no abdominal tenderness. There is right CVA tenderness and left CVA tenderness. There is no guarding or rebound.     Comments: Positive CVA tenderness bilaterally.  Musculoskeletal:     Cervical back: Neck supple.     Comments: No thoracic or lumbar midline tenderness.  Lower extremities neurovascularly intact.  Patient able to ambulate in the ED without difficulty.  Producible thoracic paraspinal tenderness.   Skin:    General: Skin is warm and dry.  Neurological:     General: No focal deficit present.     Mental Status: He is alert.  Psychiatric:        Mood and Affect: Mood normal.        Behavior: Behavior normal.     ED Results / Procedures / Treatments   Labs (all labs ordered are listed, but only abnormal results are displayed) Labs Reviewed  BASIC METABOLIC PANEL  CBC  URINALYSIS, ROUTINE W REFLEX MICROSCOPIC    EKG None  Radiology CT Renal Stone Study  Result Date: 04/21/2019 CLINICAL DATA:  Left flank pain. EXAM: CT ABDOMEN AND PELVIS WITHOUT CONTRAST TECHNIQUE: Multidetector CT imaging of the abdomen and pelvis was performed following the standard protocol without IV contrast. COMPARISON:  None. FINDINGS: Lower chest: No acute abnormality. Hepatobiliary: No focal liver abnormality is seen. No gallstones, gallbladder wall thickening, or biliary dilatation. Pancreas: Unremarkable. No pancreatic ductal dilatation or surrounding inflammatory changes. Spleen: Normal in size without focal abnormality. Adrenals/Urinary Tract: Adrenal glands are unremarkable. Kidneys are normal in  size, without focal lesions. A 2 mm nonobstructing renal stone is seen within the posterior aspect of the mid left kidney an additional ill-defined 2 mm nonobstructing renal stone is seen within the mid to lower left kidney. Multiple 1 mm and 2 mm nonobstructing renal stones are seen within the mid and lower right kidney. Bladder is unremarkable. Stomach/Bowel: Stomach is within normal limits. Appendix appears normal. No evidence of bowel wall thickening, distention, or inflammatory changes. Vascular/Lymphatic: No significant vascular findings are present. No enlarged abdominal or pelvic lymph nodes. Reproductive: Prostate is unremarkable. Other: No abdominal wall hernia or abnormality. No abdominopelvic ascites. Musculoskeletal: No acute or significant osseous findings. IMPRESSION: 1. Multiple tiny bilateral nonobstructing  renal stones. Electronically Signed   By: Aram Candela M.D.   On: 04/21/2019 19:01    Procedures Procedures (including critical care time)  Medications Ordered in ED Medications  oxyCODONE-acetaminophen (PERCOCET/ROXICET) 5-325 MG per tablet 1 tablet (1 tablet Oral Given 04/21/19 1337)  ibuprofen (ADVIL) tablet 600 mg (600 mg Oral Given 04/21/19 1741)    ED Course  I have reviewed the triage vital signs and the nursing notes.  Pertinent labs & imaging results that were available during my care of the patient were reviewed by me and considered in my medical decision making (see chart for details).    MDM Rules/Calculators/A&P                     50-year-old male presents to the ED due to mid back pain that radiates to his flank region associated with intermittent dysuria and increased urinary frequency.  Patient states he has a history of kidney stones roughly 10 to 15 years ago in which he "peed one out".  Patient denies fever, chills, nausea, and vomiting.  Stable vitals.  Per triage note patient was tachycardic at 112 upon arrival, but during my initial assessment patient  had a normal heart rate around 90.  Physical exam reassuring.  Abdomen soft, nondistended, nontender.  Positive CVA tenderness bilaterally.  Lower extremities neurovascularly intact.  No cervical, thoracic, or lumbar midline tenderness.  No overlying rash.  Will obtain routine labs and renal scan to rule out kidney stone.  UA completely unremarkable with no signs of infection.  CBC unremarkable with no leukocytosis.  BMP unremarkable with normal renal function and no electrolyte abnormalities. CT scan personally reviewed which demonstrates: 1. Multiple tiny bilateral nonobstructing renal stones.  Will discharge patient with naproxen and Flomax.  Urology number given to patient at discharge.  Advised patient to follow-up with urology if symptoms not improved within the next week. Strict ED precautions discussed with patient. Patient states understanding and agrees to plan. Patient discharged home in no acute distress and stable vitals  Final Clinical Impression(s) / ED Diagnoses Final diagnoses:  Kidney stone    Rx / DC Orders ED Discharge Orders         Ordered    naproxen (NAPROSYN) 500 MG tablet  2 times daily     04/21/19 1912    tamsulosin (FLOMAX) 0.4 MG CAPS capsule  Daily     04/21/19 1912           Jesusita Oka 04/21/19 Farris Has, MD 04/22/19 1549

## 2019-04-21 NOTE — ED Notes (Signed)
Pt ambulatory to restroom with steady gait. Tolerated well.   

## 2019-07-04 ENCOUNTER — Emergency Department (HOSPITAL_COMMUNITY): Payer: Self-pay

## 2019-07-04 ENCOUNTER — Other Ambulatory Visit: Payer: Self-pay

## 2019-07-04 ENCOUNTER — Emergency Department (HOSPITAL_COMMUNITY)
Admission: EM | Admit: 2019-07-04 | Discharge: 2019-07-05 | Disposition: A | Discharge status: Home / Self Care | Payer: Self-pay | Attending: Emergency Medicine | Admitting: Emergency Medicine

## 2019-07-04 DIAGNOSIS — F17210 Nicotine dependence, cigarettes, uncomplicated: Secondary | ICD-10-CM | POA: Insufficient documentation

## 2019-07-04 DIAGNOSIS — R410 Disorientation, unspecified: Secondary | ICD-10-CM

## 2019-07-04 DIAGNOSIS — R462 Strange and inexplicable behavior: Secondary | ICD-10-CM | POA: Diagnosis present

## 2019-07-04 DIAGNOSIS — Z79899 Other long term (current) drug therapy: Secondary | ICD-10-CM | POA: Insufficient documentation

## 2019-07-04 DIAGNOSIS — Z20822 Contact with and (suspected) exposure to covid-19: Secondary | ICD-10-CM | POA: Insufficient documentation

## 2019-07-04 DIAGNOSIS — F29 Unspecified psychosis not due to a substance or known physiological condition: Secondary | ICD-10-CM | POA: Insufficient documentation

## 2019-07-04 DIAGNOSIS — F1910 Other psychoactive substance abuse, uncomplicated: Secondary | ICD-10-CM

## 2019-07-04 DIAGNOSIS — F419 Anxiety disorder, unspecified: Secondary | ICD-10-CM | POA: Insufficient documentation

## 2019-07-04 LAB — URINALYSIS, ROUTINE W REFLEX MICROSCOPIC
Bacteria, UA: NONE SEEN
Bilirubin Urine: NEGATIVE
Glucose, UA: NEGATIVE mg/dL
Hgb urine dipstick: NEGATIVE
Ketones, ur: 5 mg/dL — AB
Leukocytes,Ua: NEGATIVE
Nitrite: NEGATIVE
Protein, ur: 30 mg/dL — AB
Specific Gravity, Urine: 1.03 (ref 1.005–1.030)
pH: 5 (ref 5.0–8.0)

## 2019-07-04 LAB — COMPREHENSIVE METABOLIC PANEL
ALT: 18 U/L (ref 0–44)
AST: 17 U/L (ref 15–41)
Albumin: 5 g/dL (ref 3.5–5.0)
Alkaline Phosphatase: 73 U/L (ref 38–126)
Anion gap: 11 (ref 5–15)
BUN: 33 mg/dL — ABNORMAL HIGH (ref 6–20)
CO2: 23 mmol/L (ref 22–32)
Calcium: 9.9 mg/dL (ref 8.9–10.3)
Chloride: 111 mmol/L (ref 98–111)
Creatinine, Ser: 1.12 mg/dL (ref 0.61–1.24)
GFR calc Af Amer: >60 mL/min (ref >60)
GFR calc non Af Amer: >60 mL/min (ref >60)
Glucose, Bld: 106 mg/dL — ABNORMAL HIGH (ref 70–99)
Potassium: 4 mmol/L (ref 3.5–5.1)
Sodium: 145 mmol/L (ref 135–145)
Total Bilirubin: 1.1 mg/dL (ref 0.3–1.2)
Total Protein: 8.5 g/dL — ABNORMAL HIGH (ref 6.5–8.1)

## 2019-07-04 LAB — CBC WITH DIFFERENTIAL/PLATELET
Abs Immature Granulocytes: 0.03 10*3/uL (ref 0.00–0.07)
Basophils Absolute: 0 10*3/uL (ref 0.0–0.1)
Basophils Relative: 0 %
Eosinophils Absolute: 0.1 10*3/uL (ref 0.0–0.5)
Eosinophils Relative: 1 %
HCT: 47 % (ref 39.0–52.0)
Hemoglobin: 16.3 g/dL (ref 13.0–17.0)
Immature Granulocytes: 0 %
Lymphocytes Relative: 18 %
Lymphs Abs: 1.7 10*3/uL (ref 0.7–4.0)
MCH: 31 pg (ref 26.0–34.0)
MCHC: 34.7 g/dL (ref 30.0–36.0)
MCV: 89.4 fL (ref 80.0–100.0)
Monocytes Absolute: 0.6 10*3/uL (ref 0.1–1.0)
Monocytes Relative: 6 %
Neutro Abs: 7 10*3/uL (ref 1.7–7.7)
Neutrophils Relative %: 75 %
Platelets: 292 10*3/uL (ref 150–400)
RBC: 5.26 MIL/uL (ref 4.22–5.81)
RDW: 12.3 % (ref 11.5–15.5)
WBC: 9.5 10*3/uL (ref 4.0–10.5)
nRBC: 0 % (ref 0.0–0.2)

## 2019-07-04 LAB — SALICYLATE LEVEL: Salicylate Lvl: <7 mg/dL — ABNORMAL LOW (ref 7.0–30.0)

## 2019-07-04 LAB — RAPID URINE DRUG SCREEN, HOSP PERFORMED
Amphetamines: NOT DETECTED
Barbiturates: NOT DETECTED
Benzodiazepines: NOT DETECTED
Cocaine: NOT DETECTED
Opiates: NOT DETECTED
Tetrahydrocannabinol: NOT DETECTED

## 2019-07-04 LAB — RESPIRATORY PANEL BY RT PCR (FLU A&B, COVID)
Influenza A by PCR: NEGATIVE
Influenza B by PCR: NEGATIVE
SARS Coronavirus 2 by RT PCR: NEGATIVE

## 2019-07-04 LAB — AMMONIA: Ammonia: 31 umol/L (ref 9–35)

## 2019-07-04 LAB — ACETAMINOPHEN LEVEL: Acetaminophen (Tylenol), Serum: <10 ug/mL — ABNORMAL LOW (ref 10–30)

## 2019-07-04 LAB — CK: Total CK: 68 U/L (ref 49–397)

## 2019-07-04 LAB — ETHANOL: Alcohol, Ethyl (B): <10 mg/dL (ref <10)

## 2019-07-04 MED ORDER — ZIPRASIDONE MESYLATE 20 MG IM SOLR
10.0000 mg | Freq: Once | INTRAMUSCULAR | Status: AC
  Administered 2019-07-04: 10 mg via INTRAMUSCULAR
  Filled 2019-07-04: qty 20

## 2019-07-04 MED ORDER — LORAZEPAM 2 MG/ML IJ SOLN
1.0000 mg | Freq: Once | INTRAMUSCULAR | Status: DC
  Administered 2019-07-04: 1 mg via INTRAMUSCULAR
  Filled 2019-07-04: qty 1

## 2019-07-04 MED ORDER — STERILE WATER FOR INJECTION IJ SOLN
INTRAMUSCULAR | Status: AC
  Filled 2019-07-04: qty 10

## 2019-07-04 MED ORDER — SODIUM CHLORIDE 0.9 % IV BOLUS (SEPSIS)
1000.0000 mL | Freq: Once | INTRAVENOUS | Status: AC
  Administered 2019-07-04: 18:00:00 1000 mL via INTRAVENOUS

## 2019-07-04 NOTE — ED Provider Notes (Signed)
[facility]-EMERGENCY DEPT Provider Note   CSN: [id number] Arrival date & time: 07/04/19  1423     History Chief Complaint  Patient presents with  . Medical Clearance    Joshua Mcdowell is a 39 y.o. male.  Patient with a history of anxiety and depression.  He was found outside without any clothes on.  He was brought in by EMS for evaluation.  Patient stated he want to go to the hospital  The history is provided by the patient. No language interpreter was used.  Altered Mental Status Presenting symptoms: behavior changes   Severity:  Severe Most recent episode:  Today Episode history:  Continuous Timing:  Constant Progression:  Worsening Chronicity:  Recurrent Context: alcohol use        Past Medical History:  Diagnosis Date  . Anxiety   . Depression     Patient Active Problem List   Diagnosis Date Noted  . MDD (major depressive disorder) 09/27/2018  . Altered mental status   . Opioid withdrawal (HCC)   . Hypokalemia   . Hypernatremia   . Tobacco abuse 09/24/2018  . Alcohol use 09/24/2018  . Opioid dependence with opioid-induced mood disorder (HCC) 09/24/2018  . Major depressive disorder, recurrent episode, moderate (HCC) 09/24/2018  . Depression   . Anxiety   . Acute metabolic encephalopathy   . AKI (acute kidney injury) (HCC)   . Leukocytosis   . Polysubstance abuse (HCC)   . Dehydration     Past Surgical History:  Procedure Laterality Date  . ORIF ANKLE FRACTURE    . WISDOM TOOTH EXTRACTION         No family history on file.  Social History   Tobacco Use  . Smoking status: Current Every Day Smoker  . Smokeless tobacco: Never Used  Substance Use Topics  . Alcohol use: Yes  . Drug use: Yes    Types: Cocaine, Heroin    Comment: narcotics, heroine    Home Medications Prior to Admission medications   Medication Sig Start Date End Date Taking? Authorizing Provider  acetaminophen (TYLENOL) 500 MG tablet Take 500-1,000 mg  by mouth daily as needed for mild pain or headache.   Yes [provider]  Multiple Vitamin (MULTIVITAMIN) tablet Take 1 tablet by mouth daily.   Yes [provider]  FLUoxetine (PROZAC) 40 MG capsule Take 1 capsule (40 mg total) by mouth daily. Patient not taking: Reported on 07/04/2019 10/01/18   Malvin Johns, MD  mirtazapine (REMERON SOL-TAB) 30 MG disintegrating tablet Take 1 tablet (30 mg total) by mouth at bedtime. Patient not taking: Reported on 07/04/2019 09/30/18   Malvin Johns, MD  naproxen (NAPROSYN) 500 MG tablet Take 1 tablet (500 mg total) by mouth 2 (two) times daily. Patient not taking: Reported on 07/04/2019 04/21/19   Mannie Stabile, PA-C  Prenatal Vit-Fe Fumarate-FA (PRENATAL MULTIVITAMIN) TABS tablet Take 1 tablet by mouth daily. Patient not taking: Reported on 07/04/2019 10/01/18   Malvin Johns, MD  traZODone (DESYREL) 300 MG tablet Take 1 tablet (300 mg total) by mouth at bedtime as needed for sleep. Patient not taking: Reported on 07/04/2019 09/30/18   Malvin Johns, MD    Allergies    Patient has no known allergies.  Review of Systems   Review of Systems  Unable to perform ROS: Mental status change    Physical Exam Updated Vital Signs BP 126/87 (BP Location: Right Arm)   Pulse 79   Temp 99.3 F (37.4 C) (Oral)  Resp 19   SpO2 99%   Physical Exam Vitals and nursing note reviewed.  Constitutional:      Appearance: He is well-developed.  HENT:     Head: Normocephalic.     Nose: Nose normal.  Eyes:     General: No scleral icterus.    Conjunctiva/sclera: Conjunctivae normal.  Neck:     Thyroid: No thyromegaly.  Cardiovascular:     Rate and Rhythm: Normal rate and regular rhythm.     Heart sounds: No murmur. No friction rub. No gallop.   Pulmonary:     Breath sounds: No stridor. No wheezing or rales.  Chest:     Chest wall: No tenderness.  Abdominal:     General: There is no distension.     Tenderness: There is no abdominal tenderness.  There is no rebound.  Musculoskeletal:        General: Normal range of motion.     Cervical back: Neck supple.  Lymphadenopathy:     Cervical: No cervical adenopathy.  Skin:    Findings: No erythema or rash.  Neurological:     General: No focal deficit present.     Mental Status: He is alert.     Motor: No abnormal muscle tone.     Coordination: Coordination normal.     Comments: Patient initially only oriented to person.  He thinks he is at a hospital but he does not know which one.  Patient is having hallucinations     ED Results / Procedures / Treatments   Labs (all labs ordered are listed, but only abnormal results are displayed) Labs Reviewed  COMPREHENSIVE METABOLIC PANEL - Abnormal; Notable for the following components:      Result Value   Glucose, Bld 106 (*)    BUN 33 (*)    Total Protein 8.5 (*)    All other components within normal limits  URINALYSIS, ROUTINE W REFLEX MICROSCOPIC - Abnormal; Notable for the following components:   Ketones, ur 5 (*)    Protein, ur 30 (*)    All other components within normal limits  RESPIRATORY PANEL BY RT PCR (FLU A&B, COVID)  CBC WITH DIFFERENTIAL/PLATELET  ETHANOL  RAPID URINE DRUG SCREEN, HOSP PERFORMED    EKG None  Radiology CT Head Wo Contrast  Result Date: 07/04/2019 CLINICAL DATA:  Altered mental status. Ataxia. EXAM: CT HEAD WITHOUT CONTRAST TECHNIQUE: Contiguous axial images were obtained from the base of the skull through the vertex without intravenous contrast. COMPARISON:  CT scan of the head dated 09/23/2018 FINDINGS: Brain: No evidence of acute infarction, hemorrhage, hydrocephalus, extra-axial collection or mass lesion/mass effect. Vascular: No hyperdense vessel or unexpected calcification. Skull: Normal. Negative for fracture or focal lesion. Sinuses/Orbits: Normal. Other: None IMPRESSION: Normal exam. Electronically Signed   By: Lorriane Shire M.D.   On: 07/04/2019 16:54    Procedures Procedures (including  critical care time)  Medications Ordered in ED Medications  sodium chloride 0.9 % bolus 1,000 mL (1,000 mLs Intravenous New Bag/Given 07/04/19 1825)    ED Course  I have reviewed the triage vital signs and the nursing notes.  Pertinent labs & imaging results that were available during my care of the patient were reviewed by me and considered in my medical decision making (see chart for details). CRITICAL CARE Performed by: Milton Ferguson Total critical care time: 50 minutes Critical care time was exclusive of separately billable procedures and treating other patients. Critical care was necessary to treat or prevent imminent or  life-threatening deterioration. Critical care was time spent personally by me on the following activities: development of treatment plan with patient and/or surrogate as well as nursing, discussions with consultants, evaluation of patient's response to treatment, examination of patient, obtaining history from patient or surrogate, ordering and performing treatments and interventions, ordering and review of laboratory studies, ordering and review of radiographic studies, pulse oximetry and re-evaluation of patient's condition.    MDM Rules/Calculators/A&P                     Patient with altered mental status.  He was seen by internal medicine [facility] hospitalist and it was felt that his altered mental status was most likely related to psychiatric problems.  All his labs have been unremarkable.  [facility] will evaluate the patient.    This patient presents to the ED for concern of altered mental status this involves an extensive number of treatment options, and is a complaint that carries with it a high risk of complications and morbidity.  The differential diagnosis includes stroke psychosis   Lab Tests:   I Ordered, reviewed, and interpreted labs, which included CBC chemistries EtOH all negative  Medicines ordered:   I ordered medication Geodon for  agitation  Imaging Studies ordered:   I ordered imaging studies which included CT head and  I independently visualized and interpreted imaging which showed normal  Additional history obtained:   Additional history obtained from old records  Previous records obtained and reviewed   Consultations Obtained:   I consulted tried hospitalist and psychiatry and discussed lab and imaging findings  Reevaluation:  After the interventions stated above, I reevaluated the patient and found patient becoming more agitated  Critical Interventions:  .    Final Clinical Impression(s) / ED Diagnoses Final diagnoses:  None    Rx / DC Orders ED Discharge Orders    None       Bethann Berkshire, MD 07/04/19 2154
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
## 2019-07-04 NOTE — ED Notes (Signed)
Pt spitting on the floor. When asked to stop pt replied okay and behavior stopped.
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
## 2019-07-05 ENCOUNTER — Ambulatory Visit (HOSPITAL_COMMUNITY)
Admission: AD | Admit: 2019-07-05 | Discharge: 2019-07-05 | Disposition: A | Discharge status: Home / Self Care | Payer: Self-pay | Attending: Psychiatry | Admitting: Psychiatry

## 2019-07-05 ENCOUNTER — Encounter (HOSPITAL_COMMUNITY): Payer: Self-pay | Admitting: Registered Nurse

## 2019-07-05 ENCOUNTER — Encounter (HOSPITAL_COMMUNITY): Payer: Self-pay

## 2019-07-05 ENCOUNTER — Emergency Department (HOSPITAL_COMMUNITY)
Admission: EM | Admit: 2019-07-05 | Discharge: 2019-07-05 | Discharge status: Against Medical Advice | Payer: Self-pay | Attending: Emergency Medicine | Admitting: Emergency Medicine

## 2019-07-05 DIAGNOSIS — Z5320 Procedure and treatment not carried out because of patient's decision for unspecified reasons: Secondary | ICD-10-CM | POA: Insufficient documentation

## 2019-07-05 DIAGNOSIS — F329 Major depressive disorder, single episode, unspecified: Secondary | ICD-10-CM | POA: Insufficient documentation

## 2019-07-05 DIAGNOSIS — F17210 Nicotine dependence, cigarettes, uncomplicated: Secondary | ICD-10-CM | POA: Insufficient documentation

## 2019-07-05 DIAGNOSIS — R462 Strange and inexplicable behavior: Secondary | ICD-10-CM | POA: Insufficient documentation

## 2019-07-05 DIAGNOSIS — F419 Anxiety disorder, unspecified: Secondary | ICD-10-CM | POA: Insufficient documentation

## 2019-07-05 DIAGNOSIS — Z7289 Other problems related to lifestyle: Secondary | ICD-10-CM | POA: Insufficient documentation

## 2019-07-05 DIAGNOSIS — Z79899 Other long term (current) drug therapy: Secondary | ICD-10-CM | POA: Insufficient documentation

## 2019-07-05 LAB — I-STAT CHEM 8, ED
BUN: 16 mg/dL (ref 6–20)
Calcium, Ion: 1.26 mmol/L (ref 1.15–1.40)
Chloride: 110 mmol/L (ref 98–111)
Creatinine, Ser: 1.1 mg/dL (ref 0.61–1.24)
Glucose, Bld: 111 mg/dL — ABNORMAL HIGH (ref 70–99)
HCT: 43 % (ref 39.0–52.0)
Hemoglobin: 14.6 g/dL (ref 13.0–17.0)
Potassium: 3.7 mmol/L (ref 3.5–5.1)
Sodium: 145 mmol/L (ref 135–145)
TCO2: 24 mmol/L (ref 22–32)

## 2019-07-05 LAB — ETHANOL: Alcohol, Ethyl (B): <10 mg/dL (ref <10)

## 2019-07-05 MED ORDER — OLANZAPINE 5 MG PO TABS: 5.0000 mg | ORAL_TABLET | Freq: Every day | ORAL | Status: DC

## 2019-07-05 MED ORDER — OLANZAPINE 5 MG PO TABS
5.0000 mg | ORAL_TABLET | ORAL | Status: AC
  Administered 2019-07-05: 5 mg via ORAL
  Filled 2019-07-05: qty 1

## 2019-07-05 MED ORDER — ZIPRASIDONE MESYLATE 20 MG IM SOLR
10.0000 mg | Freq: Once | INTRAMUSCULAR | Status: AC
  Administered 2019-07-05: 04:00:00 10 mg via INTRAMUSCULAR
  Filled 2019-07-05: qty 20

## 2019-07-05 NOTE — Consult Note (Signed)
[facility] Psych ED Discharge  07/05/2019 10:40 AM Joshua Mcdowell  MRN:  [id number] Principal Problem: Bizarre behavior Discharge Diagnoses: Principal Problem:   Bizarre behavior   Subjective: Joshua Mcdowell, 39 y.o., male patient seen via tele psych by this provider, Dr. Lucianne Muss; and chart reviewed on 07/05/19.  On evaluation Joshua Mcdowell reports that he is feeling fine this morning.  States yesterday his body started feeling hot and that is the reason he went outside without any cloths.  He would not state what he was doing prior to feeling like his body temperature was getting to hot.  He denied drug use and alcohol occasionally.  Patient denies any prior psychiatric history or psychotropics.  States that he is unemployed but his parents help him with money.  Patient denies suicidal/self-harm/homicidal ideation, psychosis, and paranoia.   During evaluation Joshua Mcdowell is alert/oriented x 4; calm/cooperative; and mood is congruent with affect.  He does not appear to be responding to internal/external stimuli or delusional thoughts.  Patient denies suicidal/self-harm/homicidal ideation, psychosis, and paranoia.  Patient answered question appropriately.  Patient given one dose of Zyprexa 5 mg.  Patient wanting to go home.  He is not a danger to himself or other.    Total Time spent with patient: 30 minutes  Past Psychiatric History: Denies  Past Medical History:  Past Medical History:  Diagnosis Date  . Anxiety   . Depression     Past Surgical History:  Procedure Laterality Date  . ORIF ANKLE FRACTURE    . WISDOM TOOTH EXTRACTION     Family History: History reviewed. No pertinent family history. Family Psychiatric  History: Denies Social History:  Social History   Substance and Sexual Activity  Alcohol Use Yes     Social History   Substance and Sexual Activity  Drug Use Yes  . Types: Cocaine, Heroin   Comment: narcotics, heroine    Social History   Socioeconomic History  .  Marital status: Single    Spouse name: Not on file  . Number of children: Not on file  . Years of education: Not on file  . Highest education level: Not on file  Occupational History  . Not on file  Tobacco Use  . Smoking status: Current Every Day Smoker  . Smokeless tobacco: Never Used  Substance and Sexual Activity  . Alcohol use: Yes  . Drug use: Yes    Types: Cocaine, Heroin    Comment: narcotics, heroine  . Sexual activity: Not on file  Other Topics Concern  . Not on file  Social History Narrative  . Not on file   Social Determinants of Health   Financial Resource Strain:   . Difficulty of Paying Living Expenses:   Food Insecurity:   . Worried About Programme researcher, broadcasting/film/video in the Last Year:   . Barista in the Last Year:   Transportation Needs:   . Freight forwarder (Medical):   Marland Kitchen Lack of Transportation (Non-Medical):   Physical Activity:   . Days of Exercise per Week:   . Minutes of Exercise per Session:   Stress:   . Feeling of Stress :   Social Connections:   . Frequency of Communication with Friends and Family:   . Frequency of Social Gatherings with Friends and Family:   . Attends Religious Services:   . Active Member of Clubs or Organizations:   . Attends Banker Meetings:   Marland Kitchen Marital Status:     Has this  patient used any form of tobacco in the last 30 days? (Cigarettes, Smokeless Tobacco, Cigars, and/or Pipes) A prescription for an FDA-approved tobacco cessation medication was offered at discharge and the patient refused  Current Medications: Current Facility-Administered Medications  Medication Dose Route Frequency Provider Last Rate Last Admin  . OLANZapine (ZYPREXA) tablet 5 mg  5 mg Oral QHS Jezelle Gullick B, NP       No current outpatient medications on file.   PTA Medications: (Not in a hospital admission)   Musculoskeletal: Strength & Muscle Tone: within normal limits Gait & Station: normal Patient leans:  N/A  Psychiatric Specialty Exam: Physical Exam Vitals and nursing note reviewed. Exam conducted with a chaperone present (Sitter at bed side).  Pulmonary:     Effort: Pulmonary effort is normal.  Neurological:     Mental Status: He is alert.  Psychiatric:        Attention and Perception: Perception normal. He does not perceive auditory or visual hallucinations.        Mood and Affect: Mood and affect normal.        Speech: Speech normal.        Behavior: Behavior normal. Behavior is cooperative.        Thought Content: Thought content normal.        Cognition and Memory: Cognition and memory normal.        Judgment: Judgment normal.     Comments: Sleepy; had to wake patient a couple of times to complete assessment     Review of Systems  Psychiatric/Behavioral: Confusion: Denies. Hallucinations: Denies. Self-injury: Denies. Sleep disturbance: Denies. Suicidal ideas: Denies.       Patient would not give the names of any drugs using.  Stating he is not sure what happened yesterday; "My body temperature went up and I was feeling hot"   All other systems reviewed and are negative.   Blood pressure 116/84, pulse 82, temperature 97.9 F (36.6 C), temperature source Oral, resp. rate 18, SpO2 100 %.There is no height or weight on file to calculate BMI.  General Appearance: Casual  Eye Contact:  Good  Speech:  Clear and Coherent and Normal Rate  Volume:  Normal  Mood:  "Good"  Affect:  Congruent  Thought Process:  Coherent and Descriptions of Associations: Intact  Orientation:  Full (Time, Place, and Person)  Thought Content:  WDL  Suicidal Thoughts:  No  Homicidal Thoughts:  No  Memory:  Immediate;   Good Recent;   Good  Judgement:  Intact  Insight:  Present  Psychomotor Activity:  Normal  Concentration:  Concentration: Good and Attention Span: Good  Recall:  Good  Fund of Knowledge:  Good  Language:  Good  Akathisia:  No  Handed:  Right  AIMS (if indicated):     Assets:   Communication Skills Desire for Improvement Housing Social Support  ADL's:  Intact  Cognition:  WNL  Sleep:        Demographic Factors:  Male and Caucasian  Loss Factors: NA  Historical Factors: Impulsivity  Risk Reduction Factors:   Sense of responsibility to family, Religious beliefs about death, Living with another person, especially a relative and Positive social support  Continued Clinical Symptoms:  Alcohol/Substance Abuse/Dependencies  Cognitive Features That Contribute To Risk:  None    Suicide Risk:  Minimal: No identifiable suicidal ideation.  Patients presenting with no risk factors but with morbid ruminations; may be classified as minimal risk based on the severity of the depressive  symptoms    Plan Of Care/Follow-up recommendations:  Activity:  As tolerated Diet:  Heart healthy    Discharge Instructions     To help you maintain a sober lifestyle, a substance abuse treatment program may be beneficial to you.  Contact one of the following facilities at your earliest opportunity to ask about enrolling:  RESIDENTIAL PROGRAMS:       [facility]      [address] [place], [place] [address]      [phone]       [facility]      [address] [place], [place] [address]      [phone]       [facility]      [address]      [place], [place] [address]      [phone]  OUTPATIENT PROGRAMS:       [facility]      [address]      [place], [place] [address]      [phone]      New patients are seen at their walk-in clinic.  Walk-in hours are Monday - Friday from 8:30 am - 12:00 pm, and from 1:00 pm - 2:30 pm.  Walk-in patients are seen on a first come, first served basis, so try to arrive as early as possible for the best chance of being seen the same day.      Disposition:  Psychiatrically Cleared No evidence of imminent risk to self or others at present.   Patient  does not meet criteria for psychiatric inpatient admission. Supportive therapy provided about ongoing stressors. Refer to IOP. Discussed crisis plan, support from social network, calling 911, coming to the Emergency Department, and calling Suicide Hotline.   Amazin Pincock, NP 07/05/2019, 10:40 AM

## 2019-07-05 NOTE — ED Notes (Signed)
Pt alert. Pt ate brk. Pt confused, disorganized, disoriented. Pt rambling, talking to self, making noises, crying.  Pt in bed, in room, sitter at bedside.

## 2019-07-05 NOTE — ED Triage Notes (Signed)
Pt brought in by [employer]. Pt states he took a "water pill" that was not his.

## 2019-07-05 NOTE — BH Assessment (Signed)
Tele Assessment Note   Patient Name: Joshua Mcdowell MRN: [id number] Referring Physician: Dr. Milton Ferguson Location of Patient: [facility] Location of Provider: [place]  Arne Schlender is an 39 y.o. male with history of depression who was found naked on his yard brought in by the police after patient requested to be taken to the hospital.  Patient in the ER was found to have periods of confusion. During assessment, patient was communicating with someone not visible to staff, word salad and statements incongruent with situation. Patient was in wrist restraints due to irrational confrontational behavior to staff, see following, per RN note, "At 2004 patient attempted multiple times to leave room. When sitter and this RN attempted to redirect him back to his room he dragged this RN backward through the unit and this RN lost footing. Patient fell onto this RN. Patient does not appear to be injured". Clinician unable to assess most of assessment due to patients current mental status.  Diagnosis: Psychosis disorder  Past Medical History:  Past Medical History:  Diagnosis Date  . Anxiety   . Depression     Past Surgical History:  Procedure Laterality Date  . ORIF ANKLE FRACTURE    . WISDOM TOOTH EXTRACTION      Family History: No family history on file.  Social History:  reports that he has been smoking. He has never used smokeless tobacco. He reports current alcohol use. He reports current drug use. Drugs: Cocaine and Heroin.  Additional Social History:  Alcohol / Drug Use Pain Medications: see MAR Prescriptions: see MAR Over the Counter: see MAR  CIWA: CIWA-Ar BP: (!) 145/90 Pulse Rate: 82 COWS:    Allergies: No Known Allergies  Home Medications: (Not in a hospital admission)   OB/GYN Status:  No LMP for male patient.  General Assessment Data Location of Assessment: [facility] ED TTS Assessment: In system Is this a Tele or Face-to-Face Assessment?: Tele  Assessment Is this an Initial Assessment or a Re-assessment for this encounter?: Initial Assessment Patient Accompanied by:: N/A Language Other than English: No Living Arrangements: Other (Comment)(father) What gender do you identify as?: Male Marital status: Single Pregnancy Status: No Living Arrangements: Other (Comment)(father) Can pt return to current living arrangement?: (unable to assess) Admission Status: Involuntary Petitioner: ED Attending Is patient capable of signing voluntary admission?: Yes Referral Source: Self/Family/Friend  Crisis Care Plan Living Arrangements: Other (Comment)(father) Legal Guardian: (self) Name of Psychiatrist: (unable to assess) Name of Therapist: (unable to assess)  Education Status Is patient currently in school?: (unable to assess)  Risk to self with the past 6 months Suicidal Ideation: (unable to assess) Has patient been a risk to self within the past 6 months prior to admission? : (unable to assess) Suicidal Intent: (unable to assess) Has patient had any suicidal intent within the past 6 months prior to admission? : (unable to assess) Is patient at risk for suicide?: (unable to assess) Suicidal Plan?: (unable to assess) Has patient had any suicidal plan within the past 6 months prior to admission? : (unable to assess) Access to Means: (unable to assess) What has been your use of drugs/alcohol within the last 12 months?: (unable to assess) Previous Attempts/Gestures: (unable to assess) How many times?: (unable to assess) Other Self Harm Risks: (unable to assess) Triggers for Past Attempts: (n/a) Intentional Self Injurious Behavior: (unable to assess) Family Suicide History: Unable to assess Recent stressful life event(s): (unable to assess) Persecutory voices/beliefs?: (UTA) Depression: (UTA) Depression Symptoms: (UTA) Substance abuse history  and/or treatment for substance abuse?: (UTA) Suicide prevention information given to  non-admitted patients: Not applicable  Risk to Others within the past 6 months Homicidal Ideation: No Does patient have any lifetime risk of violence toward others beyond the six months prior to admission? : Unknown Thoughts of Harm to Others: (UTA) Current Homicidal Intent: (UTA) Current Homicidal Plan: (UTA) Access to Homicidal Means: No Identified Victim: (none) History of harm to others?: No Assessment of Violence: None Noted Violent Behavior Description: (none) Does patient have access to weapons?: No Criminal Charges Pending?: No Does patient have a court date: No Is patient on probation?: No  Psychosis Hallucinations: Auditory, Visual Delusions: Unspecified  Mental Status Report Appearance/Hygiene: Unremarkable Eye Contact: Poor Motor Activity: Freedom of movement Speech: Word salad Level of Consciousness: Unable to assess Mood: Helpless Affect: Unable to Assess Anxiety Level: (UTA) Thought Processes: Unable to Assess Judgement: Impaired Orientation: Unable to assess Obsessive Compulsive Thoughts/Behaviors: Unable to Assess  Cognitive Functioning Concentration: Unable to Assess Memory: Unable to Assess Is patient IDD: (UTA) Insight: Unable to Assess Impulse Control: Unable to Assess Appetite: (UTAUTjdlkd) Have you had any weight changes? : (UTA) Sleep: Unable to Assess Total Hours of Sleep: (UTA) Vegetative Symptoms: Unable to Assess  ADLScreening [facility] Assessment Services) Patient's cognitive ability adequate to safely complete daily activities?: (UTA) Patient able to express need for assistance with ADLs?: (UTA) Independently performs ADLs?: (UTA)  Prior Inpatient Therapy Prior Inpatient Therapy: (UTA)  Prior Outpatient Therapy Prior Outpatient Therapy: (UTA)  ADL Screening (condition at time of admission) Patient's cognitive ability adequate to safely complete daily activities?: (UTA) Patient able to express need for assistance with ADLs?:  (UTA) Independently performs ADLs?: (UTA)  Disposition:  Disposition Initial Assessment Completed for this Encounter: Yes  Renaye Rakers, NP, patient meets inpatient criteria. TTS to secure placement.   This service was provided via telemedicine using a 2-way, interactive audio and video technology.  Names of all persons participating in this telemedicine service and their role in this encounter. Name: Joshua Mcdowell Role: Patient  Name: Al Corpus Role: TTS Clinician  Name:  Role:   Name:  Role:     Burnetta Sabin 07/05/2019 12:15 AM

## 2019-07-05 NOTE — BH Assessment (Signed)
[facility] Assessment Progress Note  Per Shuvon Rankin, FNP, this pt does not require psychiatric hospitalization at this time.  Pt presents under IVC initiated by pt's step-sister and upheld by EDP Bethann Berkshire, MD, which has been rescinded by Nelly Rout, MD.  Pt is to be discharged from [facility] with referral information for area substance abuse treatment providers.  This has been included in pt's discharge instructions.  Pt would also benefit from seeing Peer Support Specialists, and a peer support consult has been ordered for pt.  Pt's nurse, Waynetta Sandy, has been notified.  Doylene Canning, MA Triage Specialist [phone]

## 2019-07-05 NOTE — ED Provider Notes (Addendum)
[facility]-EMERGENCY DEPT Provider Note   CSN: [id number] Arrival date & time: 07/05/19  1635     History Chief Complaint  Patient presents with  . Ingestion    Joshua Mcdowell is a 39 y.o. male.  Patient picked up by police after exhibiting some bizarre behavior.  Was walking along the side of the road with no shoes wearing hospital gown.  Was just discharged here earlier this morning after being cleared by psychiatry.  Has a history of the same.  History of polysubstance abuse.  According to security staff patient was mostly out in the waiting room all day this morning.  He was found just on the block from the hospital.  He denies any pain anywhere.  He denies suicidal homicidal ideation.  Patient went to behavioral health but then was sent here for further evaluation.  He does not have any specific complaints.  Patient states that he lives in [place].  The history is provided by the patient and the police.  Illness Severity:  Mild Onset quality:  Gradual Timing:  Constant Progression:  Unchanged Chronicity:  New Associated symptoms: no abdominal pain, no chest pain, no congestion, no cough, no diarrhea, no ear pain, no fever, no rash, no shortness of breath, no sore throat and no vomiting        Past Medical History:  Diagnosis Date  . Anxiety   . Depression     Patient Active Problem List   Diagnosis Date Noted  . Bizarre behavior 07/05/2019  . MDD (major depressive disorder) 09/27/2018  . Altered mental status   . Opioid withdrawal (HCC)   . Hypokalemia   . Hypernatremia   . Tobacco abuse 09/24/2018  . Alcohol use 09/24/2018  . Opioid dependence with opioid-induced mood disorder (HCC) 09/24/2018  . Major depressive disorder, recurrent episode, moderate (HCC) 09/24/2018  . Depression   . Anxiety   . Acute metabolic encephalopathy   . AKI (acute kidney injury) (HCC)   . Leukocytosis   . Polysubstance abuse (HCC)   . Dehydration       Past Surgical History:  Procedure Laterality Date  . ORIF ANKLE FRACTURE    . WISDOM TOOTH EXTRACTION         History reviewed. No pertinent family history.  Social History   Tobacco Use  . Smoking status: Current Every Day Smoker  . Smokeless tobacco: Never Used  Substance Use Topics  . Alcohol use: Yes  . Drug use: Yes    Types: Cocaine, Heroin    Comment: narcotics, heroine    Home Medications Prior to Admission medications   Not on File    Allergies    Patient has no known allergies.  Review of Systems   Review of Systems  Constitutional: Negative for chills and fever.  HENT: Negative for congestion, ear pain and sore throat.   Eyes: Negative for pain and visual disturbance.  Respiratory: Negative for cough and shortness of breath.   Cardiovascular: Negative for chest pain and palpitations.  Gastrointestinal: Negative for abdominal pain, diarrhea and vomiting.  Genitourinary: Negative for dysuria and hematuria.  Musculoskeletal: Negative for arthralgias and back pain.  Skin: Negative for color change and rash.  Neurological: Negative for seizures and syncope.  Psychiatric/Behavioral: Positive for behavioral problems. Negative for suicidal ideas. The patient is not nervous/anxious.   All other systems reviewed and are negative.   Physical Exam Updated Vital Signs  ED Triage Vitals  Enc Vitals Group  BP 07/05/19 1651 (!) 141/95     Pulse Rate 07/05/19 1651 (!) 104     Resp 07/05/19 1651 18     Temp 07/05/19 1651 98.1 F (36.7 C)     Temp Source 07/05/19 1651 Oral     SpO2 07/05/19 1651 98 %     Weight --      Height --      Head Circumference --      Peak Flow --      Pain Score 07/05/19 1649 0     Pain Loc --      Pain Edu? --      Excl. in GC? --      Physical Exam Vitals and nursing note reviewed.  Constitutional:      Appearance: He is well-developed.  HENT:     Head: Normocephalic and atraumatic.     Nose: Nose normal.      Mouth/Throat:     Mouth: Mucous membranes are moist.  Eyes:     Extraocular Movements: Extraocular movements intact.     Conjunctiva/sclera: Conjunctivae normal.     Pupils: Pupils are equal, round, and reactive to light.  Cardiovascular:     Rate and Rhythm: Normal rate and regular rhythm.     Heart sounds: No murmur.  Pulmonary:     Effort: Pulmonary effort is normal. No respiratory distress.     Breath sounds: Normal breath sounds.  Abdominal:     Palpations: Abdomen is soft.     Tenderness: There is no abdominal tenderness.  Musculoskeletal:     Cervical back: Neck supple.  Skin:    General: Skin is warm and dry.  Neurological:     General: No focal deficit present.     Mental Status: He is alert and oriented to person, place, and time.     Cranial Nerves: No cranial nerve deficit.     Sensory: No sensory deficit.     Motor: No weakness.     Coordination: Coordination normal.  Psychiatric:        Mood and Affect: Mood normal.        Speech: Speech normal.        Thought Content: Thought content does not include homicidal or suicidal ideation. Thought content does not include homicidal or suicidal plan.        Judgment: Judgment is inappropriate.     Comments: Patient slightly odd but does not appear to be actively hallucinating, has fairly appropriate affect speech is normal     ED Results / Procedures / Treatments   Labs (all labs ordered are listed, but only abnormal results are displayed) Labs Reviewed  I-STAT CHEM 8, ED - Abnormal; Notable for the following components:      Result Value   Glucose, Bld 111 (*)    All other components within normal limits  RAPID URINE DRUG SCREEN, HOSP PERFORMED  ETHANOL    EKG EKG Interpretation  Date/Time:  Wednesday July 05 2019 17:47:54 EDT Ventricular Rate:  97 PR Interval:  132 QRS Duration: 90 QT Interval:  352 QTC Calculation: 447 R Axis:   81 Text Interpretation: Normal sinus rhythm Biatrial enlargement  Abnormal ECG Confirmed by Virgina Norfolk [phone]) on 07/05/2019 5:53:31 PM   Radiology CT Head Wo Contrast  Result Date: 07/04/2019 CLINICAL DATA:  Altered mental status. Ataxia. EXAM: CT HEAD WITHOUT CONTRAST TECHNIQUE: Contiguous axial images were obtained from the base of the skull through the vertex without intravenous contrast. COMPARISON:  CT scan of the head dated 09/23/2018 FINDINGS: Brain: No evidence of acute infarction, hemorrhage, hydrocephalus, extra-axial collection or mass lesion/mass effect. Vascular: No hyperdense vessel or unexpected calcification. Skull: Normal. Negative for fracture or focal lesion. Sinuses/Orbits: Normal. Other: None IMPRESSION: Normal exam. Electronically Signed   By: Lorriane Shire M.D.   On: 07/04/2019 16:54    Procedures Procedures (including critical care time)  Medications Ordered in ED Medications - No data to display  ED Course  I have reviewed the triage vital signs and the nursing notes.  Pertinent labs & imaging results that were available during my care of the patient were reviewed by me and considered in my medical decision making (see chart for details).    MDM Rules/Calculators/A&P                      Able Malloy is a 39 year old male with history of polysubstance abuse who presents to the ED with police.  Patient with unremarkable vitals.  Patient was discharged earlier this morning after being cleared by psychiatry per chart review.  Had some bizarre behavior that he was exhibiting but lab work was unremarkable.  Possibly concerned that he may have been abused in some type of drug.  According to security patient was mostly in the waiting room all day.  Patient was picked up by police just a few blocks from the hospital.  Patient overall appears well.  Denies any suicidal homicidal ideation.  He denies any ingestion of drugs but states that he did take a pill.  Clinically appears sober.  Normal exam.  He is able to walk without  any issues.  I-STAT labs are unremarkable.  EKG is unremarkable.  We will try to touch base with behavioral health about what they think about him but overall to me patient has slightly bizarre affect but does not appear to be manic and is not suicidal.  Patient had negative head CT yesterday. He was already cleared by psychiatry this morning and overall appears clinically stable.  Prior to talking with psychiatry patient eloped.  This chart was dictated using voice recognition software.  Despite best efforts to proofread,  errors can occur which can change the documentation meaning.     Final Clinical Impression(s) / ED Diagnoses Final diagnoses:  Bizarre behavior    Rx / DC Orders ED Discharge Orders    None       Lennice Sites, DO 07/05/19 [place], [place], DO 07/05/19 1756

## 2019-07-05 NOTE — ED Provider Notes (Signed)
Emergency Medicine Observation Re-evaluation Note  Marquie Aderhold is a 39 y.o. male, seen on rounds today.  Pt initially presented to the ED for complaints of Medical Clearance Currently, the patient is sedated.  Physical Exam  BP 116/84 (BP Location: Right Arm)   Pulse 82   Temp 97.9 F (36.6 C) (Oral)   Resp 18   SpO2 100%  Physical Exam  ED Course / MDM  EKG:EKG Interpretation  Date/Time:  Tuesday July 04 2019 18:46:27 EDT Ventricular Rate:  90 PR Interval:  138 QRS Duration: 94 QT Interval:  350 QTC Calculation: 428 R Axis:   83 Text Interpretation: Normal sinus rhythm Normal ECG Confirmed by Bethann Berkshire [phone]) on 07/04/2019 11:14:07 PM    I have reviewed the labs performed to date as well as medications administered while in observation.  Recent changes in the last 24 hours include patient was physically aggressive and required Geodon sedation.. Plan  Current plan is for inpatient bed search. Patient is under full IVC at this time.   Terrilee Files, MD 07/05/19 [phone]

## 2019-07-05 NOTE — ED Notes (Signed)
Pt DC d off unit to home per provider. Pt alert, calm, cooperative, no s/s of distress. Pt stated safe, no going to hurt self or others. DC information and resources given to and reviewed with pt. Pt signed out but it did not capture. Pt ambulatory off the unit, escorted by security .

## 2019-07-05 NOTE — ED Notes (Signed)
Adaku Anike, NP, patient meets inpatient criteria. TTS to secure placement.  

## 2019-07-05 NOTE — ED Notes (Signed)
Patient ambulated out of ED, stated he wanted to leave. Asked this RN if he would go with him, RN said no. Security to escort off the property. MD aware and walking with the patient.

## 2019-07-05 NOTE — Discharge Instructions (Signed)
To help you maintain a sober lifestyle, a substance abuse treatment program may be beneficial to you.  Contact one of the following facilities at your earliest opportunity to ask about enrolling:  RESIDENTIAL PROGRAMS:       [facility]      [address]      [place], [place] [address]      [phone]       [facility]      [address]      [place], [place] [address]      [phone]       [facility]      [address]      [place], [place] [address]      [phone]  OUTPATIENT PROGRAMS:       [facility]      [address]      [place], [place] [address]      [phone]       New patients are seen at their walk-in clinic.  Walk-in hours are Monday - Friday from 8:30 am - 12:00 pm, and from 1:00 pm - 2:30 pm.  Walk-in patients are seen on a first come, first served basis, so try to arrive as early as possible for the best chance of being seen the same day. 

## 2019-07-05 NOTE — Patient Outreach (Addendum)
CPSS met with the patient to provide substance use recovery support and provide information for substance use treatment resources. Patient denied a history of active substance use addiction. Patient's UDS was negative. Patient's EtOH test was negative. CPSS still provided the patient the patient with information for substance use recovery resources per request of Earleen Newport, FNP. Some of these resources include residential/outpatient substance use treatment center list, detox treatment center list, [facility] online/in-person meeting list, and CPSS contact information.

## 2019-07-05 NOTE — ED Provider Notes (Signed)
I was asked to evaluate the patient for reordering restraints.  Per nursing he had been aggressive and abusive.  Required Geodon and restraints previously.  On my evaluation he is agitated but cooperative and redirectable.  Reports that he took "the red" and is coming down off of that.  He will not tell me what this is and it is unclear.  He has been evaluated by TTS and is appropriate for inpatient admission.  I have ordered 10 mg of IM Geodon and requested that nursing attempt to remove restraints.  Physical Exam  BP (!) 123/99 (BP Location: Right Arm)   Pulse 68   Temp 99.4 F (37.4 C) (Oral)   Resp 16   SpO2 99%   Physical Exam Agitated but cooperative and redirectable, bilateral upper extremity restraints in place,       Shon Baton, MD 07/05/19 [phone]

## 2019-07-06 ENCOUNTER — Inpatient Hospital Stay (HOSPITAL_COMMUNITY)
Admission: RE | Admit: 2019-07-06 | Discharge: 2019-07-14 | DRG: 885 | Disposition: A | Discharge status: Home / Self Care | Payer: Federal, State, Local not specified - Other | Attending: Psychiatry | Admitting: Psychiatry

## 2019-07-06 ENCOUNTER — Encounter (HOSPITAL_COMMUNITY): Payer: Self-pay | Admitting: Psychiatry

## 2019-07-06 ENCOUNTER — Other Ambulatory Visit: Payer: Self-pay

## 2019-07-06 DIAGNOSIS — R462 Strange and inexplicable behavior: Secondary | ICD-10-CM | POA: Diagnosis present

## 2019-07-06 DIAGNOSIS — Z20822 Contact with and (suspected) exposure to covid-19: Secondary | ICD-10-CM | POA: Diagnosis present

## 2019-07-06 DIAGNOSIS — F23 Brief psychotic disorder: Secondary | ICD-10-CM | POA: Diagnosis present

## 2019-07-06 DIAGNOSIS — F419 Anxiety disorder, unspecified: Secondary | ICD-10-CM | POA: Diagnosis present

## 2019-07-06 DIAGNOSIS — F1120 Opioid dependence, uncomplicated: Secondary | ICD-10-CM | POA: Diagnosis present

## 2019-07-06 DIAGNOSIS — R809 Proteinuria, unspecified: Secondary | ICD-10-CM | POA: Diagnosis present

## 2019-07-06 DIAGNOSIS — G4700 Insomnia, unspecified: Secondary | ICD-10-CM | POA: Diagnosis present

## 2019-07-06 DIAGNOSIS — R824 Acetonuria: Secondary | ICD-10-CM | POA: Diagnosis present

## 2019-07-06 DIAGNOSIS — F29 Unspecified psychosis not due to a substance or known physiological condition: Secondary | ICD-10-CM | POA: Diagnosis present

## 2019-07-06 DIAGNOSIS — F259 Schizoaffective disorder, unspecified: Secondary | ICD-10-CM | POA: Diagnosis present

## 2019-07-06 DIAGNOSIS — F250 Schizoaffective disorder, bipolar type: Secondary | ICD-10-CM | POA: Diagnosis not present

## 2019-07-06 DIAGNOSIS — F17210 Nicotine dependence, cigarettes, uncomplicated: Secondary | ICD-10-CM | POA: Diagnosis present

## 2019-07-06 LAB — RESPIRATORY PANEL BY RT PCR (FLU A&B, COVID)
Influenza A by PCR: NEGATIVE
Influenza B by PCR: NEGATIVE
SARS Coronavirus 2 by RT PCR: NEGATIVE

## 2019-07-06 MED ORDER — OLANZAPINE 10 MG IM SOLR: 5.0000 mg | Freq: Once | INTRAMUSCULAR | Status: AC

## 2019-07-06 MED ORDER — HALOPERIDOL LACTATE 5 MG/ML IJ SOLN
10.0000 mg | Freq: Four times a day (QID) | INTRAMUSCULAR | Status: DC | PRN
  Administered 2019-07-08: 10:00:00 10 mg via INTRAMUSCULAR
  Filled 2019-07-06: qty 2

## 2019-07-06 MED ORDER — OLANZAPINE 10 MG IM SOLR
INTRAMUSCULAR | Status: AC
  Administered 2019-07-06: 03:00:00 5 mg via INTRAMUSCULAR
  Filled 2019-07-06: qty 10

## 2019-07-06 MED ORDER — MAGNESIUM HYDROXIDE 400 MG/5ML PO SUSP: 30.0000 mL | Freq: Every day | ORAL | Status: DC | PRN

## 2019-07-06 MED ORDER — ACETAMINOPHEN 325 MG PO TABS: 650.0000 mg | ORAL_TABLET | Freq: Four times a day (QID) | ORAL | Status: DC | PRN

## 2019-07-06 MED ORDER — HALOPERIDOL 5 MG PO TABS
10.0000 mg | ORAL_TABLET | Freq: Four times a day (QID) | ORAL | Status: DC | PRN
  Administered 2019-07-06 – 2019-07-13 (×5): 10 mg via ORAL
  Filled 2019-07-06 (×6): qty 2

## 2019-07-06 MED ORDER — TRAZODONE HCL 100 MG PO TABS
100.0000 mg | ORAL_TABLET | Freq: Every evening | ORAL | Status: DC | PRN
  Administered 2019-07-06: 21:00:00 100 mg via ORAL
  Filled 2019-07-06 (×2): qty 1

## 2019-07-06 MED ORDER — CLONAZEPAM 1 MG PO TABS
2.0000 mg | ORAL_TABLET | Freq: Every day | ORAL | Status: DC
  Administered 2019-07-06 – 2019-07-07 (×2): 2 mg via ORAL
  Filled 2019-07-06 (×2): qty 2

## 2019-07-06 MED ORDER — CLONAZEPAM 0.5 MG PO TABS
0.5000 mg | ORAL_TABLET | Freq: Two times a day (BID) | ORAL | Status: DC
  Administered 2019-07-06 – 2019-07-07 (×2): 0.5 mg via ORAL
  Filled 2019-07-06 (×2): qty 1

## 2019-07-06 MED ORDER — ALUM & MAG HYDROXIDE-SIMETH 200-200-20 MG/5ML PO SUSP: 30.0000 mL | ORAL | Status: DC | PRN

## 2019-07-06 MED ORDER — ACETAMINOPHEN 325 MG PO TABS
650.0000 mg | ORAL_TABLET | Freq: Four times a day (QID) | ORAL | Status: DC | PRN
  Administered 2019-07-07 – 2019-07-13 (×2): 650 mg via ORAL
  Filled 2019-07-06 (×2): qty 2

## 2019-07-06 MED ORDER — HYDROXYZINE HCL 25 MG PO TABS
25.0000 mg | ORAL_TABLET | Freq: Three times a day (TID) | ORAL | Status: DC | PRN
  Administered 2019-07-06 – 2019-07-13 (×9): 25 mg via ORAL
  Filled 2019-07-06 (×11): qty 1

## 2019-07-06 MED ORDER — TRAZODONE HCL 50 MG PO TABS
50.0000 mg | ORAL_TABLET | Freq: Every evening | ORAL | Status: DC | PRN
  Administered 2019-07-06: 03:00:00 50 mg via ORAL
  Filled 2019-07-06: qty 1

## 2019-07-06 MED ORDER — OLANZAPINE 10 MG PO TBDP
10.0000 mg | ORAL_TABLET | Freq: Two times a day (BID) | ORAL | Status: DC
  Administered 2019-07-06 – 2019-07-07 (×3): 10 mg via ORAL
  Filled 2019-07-06 (×6): qty 1

## 2019-07-06 NOTE — BH Assessment (Signed)
BHH Assessment Progress Note  Per Malvin Johns, MD, this pt requires psychiatric hospitalization.  Jasmine has assigned pt to [facility] Rm 508-1.  Pt presents under IVC initiated by a friend, and upheld by Dr Jeannine Kitten.  IVC documents may be found on pt's chart.  Pt's nurse, Lincoln Maxin, has been notified.   Doylene Canning, [place] Behavioral Health Coordinator [phone]

## 2019-07-06 NOTE — Progress Notes (Signed)
Pt came in voluntarily with friend. Pt had been seen in [facility] recently for similar behaviors and was cleared. He presented alert but confused, his answers did not coincide with the questions. Pt would make odd comments non-sensically. Pt was IVC'd later on by friend. Pt is not currently agitated and is resting in room. At times he could answer certain questions appropriately. Did not endorse SI/HI/AVH but appears to be responding to internal stimuli at times. Hard to assess patient in all categories. Did get agitated earlier and knocked on the OBS door but just wanted to ask a question, was easily redirectable. Safety checks initiated and maintained.

## 2019-07-06 NOTE — Progress Notes (Signed)
   07/06/19 2200  Psych Admission Type (Psych Patients Only)  Admission Status Involuntary  Psychosocial Assessment  Patient Complaints None  Eye Contact Brief;Avertive  Facial Expression Blank  Affect Appropriate to circumstance  Speech Tangential  Interaction Minimal  Motor Activity Fidgety;Restless;Pacing  Appearance/Hygiene In scrubs  Behavior Characteristics Anxious  Mood Labile  Thought Process  Coherency Tangential;Disorganized;Flight of ideas  Content Preoccupation  Delusions None reported or observed  Hallucination None reported or observed  Judgment Limited  Confusion Mild  Danger to Self  Current suicidal ideation? Denies  Danger to Others  Danger to Others None reported or observed

## 2019-07-06 NOTE — Progress Notes (Signed)
Pt transferred to room 508-1.  RN oriented pt to room, unit, and staff.  Pt is acclimating to his new surroundings and he is calm at this time.

## 2019-07-06 NOTE — Tx Team (Signed)
Initial Treatment Plan 07/06/2019 6:47 PM Joshua Mcdowell YLT:[id number]    PATIENT STRESSORS: Health problems Marital or family conflict Substance abuse   PATIENT STRENGTHS: Ability for insight Average or above average intelligence Supportive family/friends   PATIENT IDENTIFIED PROBLEMS: Depression  Substance Use Disorder                   DISCHARGE CRITERIA:  Ability to meet basic life and health needs Improved stabilization in mood, thinking, and/or behavior Verbal commitment to aftercare and medication compliance  PRELIMINARY DISCHARGE PLAN: Attend 12-step recovery group Outpatient therapy  PATIENT/FAMILY INVOLVEMENT: This treatment plan has been presented to and reviewed with the patient, Joshua Mcdowell.  The patient has been given the opportunity to ask questions and make suggestions.  Garnette Scheuermann, RN 07/06/2019, 6:47 PM

## 2019-07-06 NOTE — Plan of Care (Signed)
BHH Observation Crisis Plan  Reason for Crisis Plan:  Crisis Stabilization   Plan of Care:  Referral for Inpatient Hospitalization  Family Support:      Current Living Environment:     Insurance:   Hospital Account    Name Acct ID Class Status Primary Coverage   Jed, Kutch [id number] BEHAVIORAL HEALTH OBSERVATION [facility] FOR MH/DD/SAS - 3-WAY [place]        Guarantor Account (for Hospital Account [id number])    Name Relation to Pt Service Area Active? Acct Type   Randall Hiss Self [facility] Yes Behavioral Health   Address Phone       [address] [place], [place] [address] [phone](H)          Coverage Information (for Hospital Account [id number])    F/O Payor/Plan Precert #   [facility] FOR MH/DD/SAS/3-WAY [facility]    Subscriber Subscriber #   Kipper, Buch [id number]   Address Phone   [address] [place], [place] [address] [phone]      Legal Guardian:     Primary Care Provider:  Patient, No Pcp Per  Current Outpatient Providers:  none  Psychiatrist:     Counselor/Therapist:     Compliant with Medications:  No  Additional Information:   Joseph Berkshire 4/22/20211:27 AM

## 2019-07-06 NOTE — Progress Notes (Signed)
Pt was resting through the night. Pt was awake on and off through the night but posed no issues after meds given. Safety maintained.

## 2019-07-06 NOTE — BHH Suicide Risk Assessment (Signed)
[facility] Admission Suicide Risk Assessment   Nursing information obtained from:  Review of record Demographic factors:  Male, Low socioeconomic status, Caucasian, Unemployed Current Mental Status:  NA Loss Factors:  NA Historical Factors:  NA Risk Reduction Factors:  NA  Total Time spent with patient: 45 minutes Principal Problem: Bizarre behavior Diagnosis:  Principal Problem:   Bizarre behavior Active Problems:   Psychosis (HCC)  Subjective Data: Confusional state/substance abuse with negative drug screen on presentation  Continued Clinical Symptoms:    The "Alcohol Use Disorders Identification Test", Guidelines for Use in Primary Care, Second Edition.  World Science writer [facility]). Score between 0-7:  no or low risk or alcohol related problems. Score between 8-15:  moderate risk of alcohol related problems. Score between 16-19:  high risk of alcohol related problems. Score 20 or above:  warrants further diagnostic evaluation for alcohol dependence and treatment.   CLINICAL FACTORS:   Alcohol/Substance Abuse/Dependencies   Musculoskeletal: Strength & Muscle Tone: within normal limits Gait & Station: normal Patient leans: N/A  Psychiatric Specialty Exam: Physical Exam  Nursing note and vitals reviewed. Constitutional: He appears well-developed and well-nourished.  Cardiovascular: Normal rate and regular rhythm.    Review of Systems  Constitutional: Negative.   Eyes: Negative.   Respiratory: Negative.   Cardiovascular: Negative.   Gastrointestinal: Negative.   Endocrine: Negative.   Genitourinary: Negative.   Musculoskeletal: Negative.   Neurological: Negative.     Blood pressure 126/83, pulse (!) 136, temperature 97.8 F (36.6 C), temperature source Oral, resp. rate 18.There is no height or weight on file to calculate BMI.  General Appearance: Disheveled  Eye Contact:  Poor  Speech:  Slow  Volume:  Decreased  Mood:  Dysphoric  Affect:  Flat  Thought  Process:  Disorganized  Orientation:  Other:  person - only  Thought Content:  Illogical and Delusions  Suicidal Thoughts:  No  Homicidal Thoughts:  No  Memory:  Immediate;   Poor Recent;   Poor Remote;   Poor  Judgement:  Impaired  Insight:  Lacking  Psychomotor Activity:  Decreased  Concentration:  Concentration: Poor and Attention Span: Poor  Recall:  Poor  Fund of Knowledge:  Poor  Language:  Poor  Akathisia:  Negative  Handed:  Right  AIMS (if indicated):     Assets:  Leisure Time Physical Health Resilience  ADL's:  Impaired  Cognition:  Impaired,  Moderate  Sleep:      COGNITIVE FEATURES THAT CONTRIBUTE TO RISK:  Loss of executive function and Thought constriction (tunnel vision)    SUICIDE RISK:   Minimal: No identifiable suicidal ideation.  Patients presenting with no risk factors but with morbid ruminations; may be classified as minimal risk based on the severity of the depressive symptoms  PLAN OF CARE: see eval  I certify that inpatient services furnished can reasonably be expected to improve the patient's condition.   Malvin Johns, MD 07/06/2019, 11:34 AM

## 2019-07-06 NOTE — Progress Notes (Addendum)
Pt was becoming increasingly agitated, pacing the hallway, singing loudly, yelling and disrupting the hall. Spoke with pt who agreed to take medication for behaviors. Zyprexa 5mg  IM given without incident, accepted willingly with little encouragement. Agreeable to treatment. Sitting on the edge of bed at this time. Safety checks continued.   Pt would not sign all of the admit paperwork.

## 2019-07-06 NOTE — BH Assessment (Signed)
Assessment Note  Joshua Mcdowell is an 39 y.o. male.  -Patient was brought to [facility] by his friend Joshua Mcdowell.  Pt gave verbal consent to talk to [facility].  She said that she found patient in scrubs downtown.  Someone had let him sit in their car to keep warm.    A review of Epic notes show that [facility] left [facility] around 17:54 today (04/21).  Pt had been discharged from [facility] around noon after his IVC had been rescinded.  Pt had evidently left [facility] around noon and returned.  Patient has hx of substance abuse.  He says he has used different drugs in the past and probably currently.  Pt is slightly agitated but is able to be redirected.  He does appear to be under the influence of a substance.  He is oriented x2.  Pt talks non-sensically.  He has good eye contact and does appear to be responding to internal stimuli.  Thought process is not logical or coherent.  Patient denies any SI or HI.  He says he sees colors and shapes shift.  Pt says he hears voices but is unclear about what they say.  Pt talks about how magnets are causing the Earth to spin out of orbit and no one is doing anything about it.  Pt asked RN Joshua Mcdowell if we knew where the backpacks are being made here.  He seemed agitated but cooperated with the answers given.  Pt talks in word salad at times and at other times looks like he is trying to be coherent.    Patient is unable to tell where he has been for inpatient care in the past.  A review of the chart shows that patient was at [facility] in 2020.  Pt has no current outpatient provider.  -Patient seen also by Joshua Rakers, NP.  She said that patient would need to be observed overnight.  Pt's friend Joshua Mcdowell said she was going to go to [employer] about IVC for patient.  Pt is currently on the OBS unit.  Diagnosis: Bizarre behavior  Past Medical History:  Past Medical History:  Diagnosis Date  . Anxiety   . Depression     Past Surgical History:  Procedure Laterality Date  . ORIF ANKLE FRACTURE     . WISDOM TOOTH EXTRACTION      Family History: No family history on file.  Social History:  reports that he has been smoking. He has never used smokeless tobacco. He reports current alcohol use. He reports current drug use. Drugs: Cocaine and Heroin.  Additional Social History:  Alcohol / Drug Use Pain Medications: None Prescriptions: None Over the Counter: None History of alcohol / drug use?: (Pt is denying any recent use.)  CIWA:   COWS:    Allergies: No Known Allergies  Home Medications: (Not in a hospital admission)   OB/GYN Status:  No LMP for male patient.  General Assessment Data Location of Assessment: [facility] Assessment Services TTS Assessment: In system Is this a Tele or Face-to-Face Assessment?: Face-to-Face Is this an Initial Assessment or a Re-assessment for this encounter?: Initial Assessment Patient Accompanied by:: N/A Language Other than English: No Living Arrangements: Other (Comment)(Says he is living with friend.) What gender do you identify as?: Male Marital status: Single Pregnancy Status: No Living Arrangements: Non-relatives/Friends Can pt return to current living arrangement?: Yes Admission Status: Voluntary Is patient capable of signing voluntary admission?: Yes Referral Source: Self/Family/Friend(Friend) Insurance type: self pay  Medical Screening Exam [facility] Walk-in ONLY) Medical  Exam completed: Joshua Singer, NP)  Crisis Care Plan Living Arrangements: Non-relatives/Friends Name of Psychiatrist: None Name of Therapist: [facility]  Education Status Is patient currently in school?: No Is the patient employed, unemployed or receiving disability?: Unemployed  Risk to self with the past 6 months Suicidal Ideation: No Has patient been a risk to self within the past 6 months prior to admission? : No Suicidal Intent: No Has patient had any suicidal intent within the past 6 months prior to admission? : No Is patient at risk for suicide?:  No Suicidal Plan?: No Has patient had any suicidal plan within the past 6 months prior to admission? : No Access to Means: No What has been your use of drugs/alcohol within the last 12 months?: Unknown Previous Attempts/Gestures: No How many times?: 0 Other Self Harm Risks: None Triggers for Past Attempts: None known Intentional Self Injurious Behavior: None Family Suicide History: No Recent stressful life event(s): Other (Comment)(Pt does not identify a stressor,) Persecutory voices/beliefs?: Yes Depression: No Depression Symptoms: (Denies depressive symptoms) Substance abuse history and/or treatment for substance abuse?: No Suicide prevention information given to non-admitted patients: Not applicable  Risk to Others within the past 6 months Homicidal Ideation: No Does patient have any lifetime risk of violence toward others beyond the six months prior to admission? : No Thoughts of Harm to Others: No Current Homicidal Intent: No Current Homicidal Plan: No Access to Homicidal Means: No Identified Victim: No one History of harm to others?: Yes Assessment of Violence: In past 6-12 months(Pt was aggressive at [facility] on 07/04/19.) Violent Behavior Description: Pt denies. (Pt did not remember last night) Does patient have access to weapons?: No(Thought he may have trespass charge.) Criminal Charges Pending?: No Does patient have a court date: No Is patient on probation?: No  Psychosis Hallucinations: Auditory, Visual(See shapes shift and colors change) Delusions: Unspecified  Mental Status Report Appearance/Hygiene: Unremarkable Eye Contact: Good Motor Activity: Freedom of movement, Unremarkable Speech: Incoherent, Word salad Level of Consciousness: Alert Mood: Anxious Affect: Anxious Anxiety Level: Moderate Thought Processes: Irrelevant, Tangential, Flight of Ideas Judgement: Impaired Orientation: Not oriented Obsessive Compulsive Thoughts/Behaviors: None  Cognitive  Functioning Concentration: Normal Memory: Recent Impaired, Remote Impaired Is patient IDD: No Insight: Poor Impulse Control: Poor Appetite: Fair Have you had any weight changes? : No Change Sleep: Unable to Assess Total Hours of Sleep: (Pt cannot say.) Vegetative Symptoms: Unable to Assess  ADLScreening [facility] Assessment Services) Patient's cognitive ability adequate to safely complete daily activities?: Yes Patient able to express need for assistance with ADLs?: Yes Independently performs ADLs?: Yes (appropriate for developmental age)  Prior Inpatient Therapy Prior Inpatient Therapy: (Unknown)  Prior Outpatient Therapy Prior Outpatient Therapy: No Does patient have an ACCT team?: No Does patient have Intensive In-House Services?  : No Does patient have [facility] services? : No Does patient have P4CC services?: No  ADL Screening (condition at time of admission) Patient's cognitive ability adequate to safely complete daily activities?: Yes Is the patient deaf or have difficulty hearing?: No Does the patient have difficulty seeing, even when wearing glasses/contacts?: No Does the patient have difficulty concentrating, remembering, or making decisions?: Yes Patient able to express need for assistance with ADLs?: Yes Does the patient have difficulty dressing or bathing?: No Independently performs ADLs?: Yes (appropriate for developmental age) Does the patient have difficulty walking or climbing stairs?: No Weakness of Legs: None Weakness of Arms/Hands: None       Abuse/Neglect Assessment (Assessment to be complete while patient is  alone) Abuse/Neglect Assessment Can Be Completed: Yes Physical Abuse: Denies Verbal Abuse: Denies Sexual Abuse: Denies Exploitation of patient/patient's resources: Denies Self-Neglect: Denies     Regulatory affairs officer (For Healthcare) Does Patient Have a Medical Advance Directive?: No Would patient like information on creating a medical advance  directive?: No - Patient declined          Disposition:     On Site Evaluation by:   Reviewed with Physician:    Raymondo Band 07/06/2019 12:18 AM

## 2019-07-06 NOTE — Progress Notes (Signed)
Pt transferred to the Adult Unit ([address] hall) as ordered this evening. He was made aware and was cooperative with transfer. Report given to receiving nurse Tyler Aas, RN.Safety maintained.

## 2019-07-06 NOTE — Progress Notes (Signed)
Patient ID: Joshua Mcdowell, male   DOB: September 16, 1980, 39 y.o.   MRN: [id number] Abrasion quarter sized observed on pt's right elbow area, and he states that he fell prior to coming into the hospital.

## 2019-07-06 NOTE — H&P (Signed)
[facility] Provider Admission PAA/H&P  Patient Identification: Joshua Mcdowell MRN:  [id number] Date of Evaluation:  07/06/2019 Chief Complaint:  Bizarre behavior [R46.2] Principal Diagnosis: Bizarre behavior Diagnosis:  Principal Problem:   Bizarre behavior  History of Present Illness:   Joshua Mcdowell is an 39 y.o. male who presents to [facility] as a walk-in  brought in by his friend Jinny Blossom. She said that she found patient in scrubs downtown where someone had let him sit in their car to keep warm. Pt is oriented to person and place. Pt is withdrawn and innattentive and appears to be under the influence. Pt talks nonsensically ans is illogical and non coherent. Pt states "My mind, trying to fix it". This provider was unable to complete an assessment.    Associated Signs/Symptoms: Depression Symptoms:  unable to access   (Hypo) Manic Symptoms:  unable to access   Anxiety Symptoms:  unable to access   Psychotic Symptoms:  unable to access   PTSD Symptoms: unable to access   Total Time spent with patient: 15 minutes  Past Psychiatric History: Yes  Is the patient at risk to self? Yes.    Has the patient been a risk to self in the past 6 months? No.  Has the patient been a risk to self within the distant past? No.  Is the patient a risk to others? No.  Has the patient been a risk to others in the past 6 months? No.  Has the patient been a risk to others within the distant past? No.   Prior Inpatient Therapy:   Prior Outpatient Therapy:    Alcohol Screening: Patient refused Alcohol Screening Tool: Yes Substance Abuse History in the last 12 months:  Yes.   Consequences of Substance Abuse: Withdrawal Symptoms:   unable to access   Previous Psychotropic Medications: Yes  Psychological Evaluations: Yes  Past Medical History:  Past Medical History:  Diagnosis Date  . Anxiety   . Depression     Past Surgical History:  Procedure Laterality Date  . ORIF ANKLE  FRACTURE    . WISDOM TOOTH EXTRACTION     Family History: History reviewed. No pertinent family history. Family Psychiatric History: Unknown Tobacco Screening:   Social History:  Social History   Substance and Sexual Activity  Alcohol Use Yes     Social History   Substance and Sexual Activity  Drug Use Yes  . Types: Cocaine, Heroin    Additional Social History:                           Allergies:  No Known Allergies Lab Results:  Results for orders placed or performed during the hospital encounter of 07/06/19 (from the past 48 hour(s))  Respiratory Panel by RT PCR (Flu A&B, Covid) - Nasopharyngeal Swab     Status: None   Collection Time: 07/06/19  1:15 AM   Specimen: Nasopharyngeal Swab  Result Value Ref Range   SARS Coronavirus 2 by RT PCR NEGATIVE NEGATIVE    Comment: (NOTE) SARS-CoV-2 target nucleic acids are NOT DETECTED. The SARS-CoV-2 RNA is generally detectable in upper respiratoy specimens during the acute phase of infection. The lowest concentration of SARS-CoV-2 viral copies this assay can detect is 131 copies/mL. A negative result does not preclude SARS-Cov-2 infection and should not be used as the sole basis for treatment or other patient management decisions. A negative result may occur with  improper specimen collection/handling, submission of  specimen other than nasopharyngeal swab, presence of viral mutation(s) within the areas targeted by this assay, and inadequate number of viral copies (<131 copies/mL). A negative result must be combined with clinical observations, patient history, and epidemiological information. The expected result is Negative. Fact Sheet for Patients:  https://www.moore.com/ Fact Sheet for Healthcare Providers:  https://www.young.biz/ This test is not yet ap proved or cleared by the [place] FDA and  has been authorized for detection and/or diagnosis of SARS-CoV-2 by FDA  under an Emergency Use Authorization (EUA). This EUA will remain  in effect (meaning this test can be used) for the duration of the COVID-19 declaration under Section 564(b)(1) of the Act, 21 U.S.C. section 360bbb-3(b)(1), unless the authorization is terminated or revoked sooner.    Influenza A by PCR NEGATIVE NEGATIVE   Influenza B by PCR NEGATIVE NEGATIVE    Comment: (NOTE) The Xpert Xpress SARS-CoV-2/FLU/RSV assay is intended as an aid in  the diagnosis of influenza from Nasopharyngeal swab specimens and  should not be used as a sole basis for treatment. Nasal washings and  aspirates are unacceptable for Xpert Xpress SARS-CoV-2/FLU/RSV  testing. Fact Sheet for Patients: https://www.moore.com/ Fact Sheet for Healthcare Providers: https://www.young.biz/ This test is not yet approved or cleared by the [place] FDA and  has been authorized for detection and/or diagnosis of SARS-CoV-2 by  FDA under an Emergency Use Authorization (EUA). This EUA will remain  in effect (meaning this test can be used) for the duration of the  Covid-19 declaration under Section 564(b)(1) of the Act, 21  U.S.C. section 360bbb-3(b)(1), unless the authorization is  terminated or revoked. Performed at [facility], [address], [place], [place] [address]     Blood Alcohol level:  Lab Results  Component Value Date   ETH <10 07/05/2019   ETH <10 07/04/2019    Metabolic Disorder Labs:  No results found for: HGBA1C, MPG No results found for: PROLACTIN No results found for: CHOL, TRIG, HDL, CHOLHDL, VLDL, LDLCALC  Current Medications: Current Facility-Administered Medications  Medication Dose Route Frequency Provider Last Rate Last Admin  . acetaminophen (TYLENOL) tablet 650 mg  650 mg Oral Q6H PRN Camry Robello C, NP      . alum & mag hydroxide-simeth (MAALOX/MYLANTA) 200-200-20 MG/5ML suspension 30 mL  30 mL Oral Q4H PRN Terrilyn Tyner C,  NP      . hydrOXYzine (ATARAX/VISTARIL) tablet 25 mg  25 mg Oral TID PRN Michelangelo Rindfleisch C, NP      . magnesium hydroxide (MILK OF MAGNESIA) suspension 30 mL  30 mL Oral Daily PRN Bertrice Leder C, NP      . OLANZapine (ZYPREXA) injection 5 mg  5 mg Intramuscular Once Kallan Bischoff C, NP      . traZODone (DESYREL) tablet 50 mg  50 mg Oral QHS PRN Dariana Garbett C, NP       PTA Medications: No medications prior to admission.    Musculoskeletal: Strength & Muscle Tone: within normal limits Gait & Station: normal Patient leans: N/A  Psychiatric Specialty Exam: Physical Exam  Constitutional: He appears well-developed and well-nourished.  HENT:  Head: Normocephalic.  Eyes: Pupils are equal, round, and reactive to light.  Respiratory: Effort normal.  Musculoskeletal:        General: Normal range of motion.     Cervical back: Normal range of motion.  Neurological: He is alert.  Skin: Skin is warm and dry.  Psychiatric: Thought content normal. His speech is slurred. He is  slowed. Cognition and memory are impaired. He expresses impulsivity. He exhibits a depressed mood. He is inattentive.    Review of Systems  Psychiatric/Behavioral: Positive for decreased concentration. Hallucinations: unable to access. Suicidal ideas: unable to access. Nervous/anxious: unable to access.   All other systems reviewed and are negative.   Blood pressure 114/83, pulse 100, temperature 98.3 F (36.8 C), temperature source Oral, resp. rate 18.There is no height or weight on file to calculate BMI.  General Appearance: Disheveled  Eye Contact:  Poor  Speech:  Slurred  Volume:  Decreased  Mood:  Dysphoric  Affect:  Congruent  Thought Process:  Irrelevant and Descriptions of Associations: Loose  Orientation:  Other:  Person and place  Thought Content:  Illogical  Suicidal Thoughts:  unable to access    Homicidal Thoughts:  unable to access    Memory:  unable to access    Judgement:  Impaired  Insight:  unable  to access    Psychomotor Activity:  Decreased  Concentration:  Concentration: Poor  Recall:  Poor  Fund of Knowledge:  Poor  Language:  Poor  Akathisia:  No  Handed:  Right  AIMS (if indicated):     Assets:  Others:  unable to access    ADL's:  Impaired  Cognition:  Impaired,  Moderate  Sleep:      Disposition: Recommend overnight observation Supportive therapy provided about ongoing stressors.  Treatment Plan Summary: Daily contact with patient to assess and evaluate symptoms and progress in treatment and Medication management  Observation Level/Precautions:  15 minute checks Laboratory:  up to date Psychotherapy:   Medications:   Consultations:   Discharge Concerns:   Estimated LOS: Other:      Wandra Arthurs, NP 4/22/20212:52 AM

## 2019-07-06 NOTE — H&P (Signed)
Psychiatric Admission Assessment Adult  Patient Identification: Joshua Mcdowell MRN:  [id number] Date of Evaluation:  07/06/2019 Chief Complaint:  Bizarre behavior [R46.2] Psychosis ([place]) [F29] Principal Diagnosis: Bizarre behavior Diagnosis:  Principal Problem:   Bizarre behavior Active Problems:   Psychosis ([place])  History of Present Illness:   This is a repeat admission, the latest of multiple healthcare centers and psychiatric interventions for Joshua Mcdowell, a 40 year old patient with chronic polysubstance abuse, presenting twice within 48 hours to the emergency department.  On his presentation of 4/20 he was described as "found lying nude in his yard" EMS brought him in, and further found various pills in his room, foil and spoons as if he had been abusing heroin, and further, they suspected he had overdosed on Ambien.  Last drug screen was negative at any rate the patient cleared in a reasonable timeframe with IM medications, and was discharged on 4/21 only to re-present 5 hours later with again confusional psychosis.  The patient is known to the service he was here in July, at that point he was suffering from a severe depression with a failure to thrive even lying in bed all day not eating but defecating and urinating on himself and there was substance abuse at that point in time suspected, but again drug screen was negative at that point in time.  At the present time the patient is very confused he echoes some of my questions he can follow one-step commands simple commands, his speech is slow and halting and his disorganized thought is reflected in his speech which is essentially nonsensical.  There is no evidence of responding to stimuli.  The patient's father elaborates that he has had obsessive-compulsive disorder since childhood, there is no history of head trauma but there is a longstanding history of substance abuse.  His father states he is never seen him "hallucinate without  drugs" in his system.    He has a history though of mutism at 1 point in the context of psychosis, and has had several interventions he has walked away from group homes when his father has placed him in them and he fears that he will be continually homeless as the patient did find an apartment with 2 roommates but entered the room of a 41 year old girl in the patient's roommates are now too scared of him and will not let him back home.  Further he is not allowed to stay at his father's house that is his girlfriend's house and she is also fearful of him.  At any rate we will discuss this with social work but at the present time the patient is clearly in a psychotic state, chronic substance abuse issues are noted, he is currently disorganized but redirectable.   . Associated Signs/Symptoms: Depression Symptoms:  psychomotor retardation, (Hypo) Manic Symptoms:  Delusions, Distractibility, Anxiety Symptoms:  n/a Psychotic Symptoms:  Delusions, PTSD Symptoms: NA Total Time spent with patient: 45 minutes  Past Psychiatric History:    Is the patient at risk to self? Yes.    Has the patient been a risk to self in the past 6 months? Yes.    Has the patient been a risk to self within the distant past? Yes.    Is the patient a risk to others? Yes.    Has the patient been a risk to others in the past 6 months? Yes.    Has the patient been a risk to others within the distant past? Yes.     Prior Inpatient Therapy:  Prior Outpatient Therapy:    Alcohol Screening: Patient refused Alcohol Screening Tool: Yes Substance Abuse History in the last 12 months:  Yes.   Consequences of Substance Abuse: Medical Consequences:  psychosis Previous Psychotropic Medications: Yes  Psychological Evaluations: No  Past Medical History:  Past Medical History:  Diagnosis Date  . Anxiety   . Depression     Past Surgical History:  Procedure Laterality Date  . ORIF ANKLE FRACTURE    . WISDOM TOOTH EXTRACTION      Family History: History reviewed. No pertinent family history. Family Psychiatric  History: Maternal great grandmother had an ill-defined but chronic lifelong psychotic disorder, mother was diagnosed with "severe personality disorder" according to his father and she committed suicide, writing a suicide note in 4 different personalities, sister died of a heroin overdose. Tobacco Screening:   Social History:  Social History   Substance and Sexual Activity  Alcohol Use Yes     Social History   Substance and Sexual Activity  Drug Use Yes  . Types: Cocaine, Heroin    Additional Social History:   Allergies:  No Known Allergies Lab Results:  Results for orders placed or performed during the hospital encounter of 07/06/19 (from the past 48 hour(s))  Respiratory Panel by RT PCR (Flu A&B, Covid) - Nasopharyngeal Swab     Status: None   Collection Time: 07/06/19  1:15 AM   Specimen: Nasopharyngeal Swab  Result Value Ref Range   SARS Coronavirus 2 by RT PCR NEGATIVE NEGATIVE    Comment: (NOTE) SARS-CoV-2 target nucleic acids are NOT DETECTED. The SARS-CoV-2 RNA is generally detectable in upper respiratoy specimens during the acute phase of infection. The lowest concentration of SARS-CoV-2 viral copies this assay can detect is 131 copies/mL. A negative result does not preclude SARS-Cov-2 infection and should not be used as the sole basis for treatment or other patient management decisions. A negative result may occur with  improper specimen collection/handling, submission of specimen other than nasopharyngeal swab, presence of viral mutation(s) within the areas targeted by this assay, and inadequate number of viral copies (<131 copies/mL). A negative result must be combined with clinical observations, patient history, and epidemiological information. The expected result is Negative. Fact Sheet for Patients:  https://www.moore.com/ Fact Sheet for Healthcare  Providers:  https://www.young.biz/ This test is not yet ap proved or cleared by the [place] FDA and  has been authorized for detection and/or diagnosis of SARS-CoV-2 by FDA under an Emergency Use Authorization (EUA). This EUA will remain  in effect (meaning this test can be used) for the duration of the COVID-19 declaration under Section 564(b)(1) of the Act, 21 U.S.C. section 360bbb-3(b)(1), unless the authorization is terminated or revoked sooner.    Influenza A by PCR NEGATIVE NEGATIVE   Influenza B by PCR NEGATIVE NEGATIVE    Comment: (NOTE) The Xpert Xpress SARS-CoV-2/FLU/RSV assay is intended as an aid in  the diagnosis of influenza from Nasopharyngeal swab specimens and  should not be used as a sole basis for treatment. Nasal washings and  aspirates are unacceptable for Xpert Xpress SARS-CoV-2/FLU/RSV  testing. Fact Sheet for Patients: https://www.moore.com/ Fact Sheet for Healthcare Providers: https://www.young.biz/ This test is not yet approved or cleared by the [place] FDA and  has been authorized for detection and/or diagnosis of SARS-CoV-2 by  FDA under an Emergency Use Authorization (EUA). This EUA will remain  in effect (meaning this test can be used) for the duration of the  Covid-19 declaration under Section 564(b)(1)  of the Act, 21  U.S.C. section 360bbb-3(b)(1), unless the authorization is  terminated or revoked. Performed at [facility], [address] [place], [place] [address]     Blood Alcohol level:  Lab Results  Component Value Date   ETH <10 07/05/2019   ETH <10 07/04/2019    Metabolic Disorder Labs:  No results found for: HGBA1C, MPG No results found for: PROLACTIN No results found for: CHOL, TRIG, HDL, CHOLHDL, VLDL, LDLCALC  Current Medications: Current Facility-Administered Medications  Medication Dose Route Frequency Provider Last Rate Last Admin  .  acetaminophen (TYLENOL) tablet 650 mg  650 mg Oral Q6H PRN Anike, Adaku C, NP      . acetaminophen (TYLENOL) tablet 650 mg  650 mg Oral Q6H PRN Malvin Johns, MD      . alum & mag hydroxide-simeth (MAALOX/MYLANTA) 200-200-20 MG/5ML suspension 30 mL  30 mL Oral Q4H PRN Anike, Adaku C, NP      . alum & mag hydroxide-simeth (MAALOX/MYLANTA) 200-200-20 MG/5ML suspension 30 mL  30 mL Oral Q4H PRN Malvin Johns, MD      . clonazePAM Scarlette [place]ico) tablet 0.5 mg  0.5 mg Oral BID Malvin Johns, MD      . clonazePAM Scarlette [place]ico) tablet 2 mg  2 mg Oral QHS Malvin Johns, MD      . haloperidol (HALDOL) tablet 10 mg  10 mg Oral Q6H PRN Malvin Johns, MD       Or  . haloperidol lactate (HALDOL) injection 10 mg  10 mg Intramuscular Q6H PRN Malvin Johns, MD      . hydrOXYzine (ATARAX/VISTARIL) tablet 25 mg  25 mg Oral TID PRN Anike, Adaku C, NP   25 mg at 07/06/19 0317  . magnesium hydroxide (MILK OF MAGNESIA) suspension 30 mL  30 mL Oral Daily PRN Anike, Adaku C, NP      . magnesium hydroxide (MILK OF MAGNESIA) suspension 30 mL  30 mL Oral Daily PRN Malvin Johns, MD      . OLANZapine zydis (ZYPREXA) disintegrating tablet 10 mg  10 mg Oral BID Malvin Johns, MD      . traZODone (DESYREL) tablet 100 mg  100 mg Oral QHS PRN Malvin Johns, MD       PTA Medications: No medications prior to admission.    Musculoskeletal: Strength & Muscle Tone: within normal limits Gait & Station: normal Patient leans: N/A  Psychiatric Specialty Exam: Physical Exam  Nursing note and vitals reviewed. Constitutional: He appears well-developed and well-nourished.  Cardiovascular: Normal rate and regular rhythm.    Review of Systems  Constitutional: Negative.   Eyes: Negative.   Respiratory: Negative.   Cardiovascular: Negative.   Gastrointestinal: Negative.   Endocrine: Negative.   Genitourinary: Negative.   Musculoskeletal: Negative.   Neurological: Negative.     Blood pressure 126/83, pulse (!) 136, temperature 97.8 F (36.6  C), temperature source Oral, resp. rate 18.There is no height or weight on file to calculate BMI.  General Appearance: Disheveled  Eye Contact:  Poor  Speech:  Slow  Volume:  Decreased  Mood:  Dysphoric  Affect:  Flat  Thought Process:  Disorganized  Orientation:  Other:  person - only  Thought Content:  Illogical and Delusions  Suicidal Thoughts:  No  Homicidal Thoughts:  No  Memory:  Immediate;   Poor Recent;   Poor Remote;   Poor  Judgement:  Impaired  Insight:  Lacking  Psychomotor Activity:  Decreased  Concentration:  Concentration: Poor and Attention Span:  Poor  Recall:  Poor  Fund of Knowledge:  Poor  Language:  Poor  Akathisia:  Negative  Handed:  Right  AIMS (if indicated):     Assets:  Leisure Time Physical Health Resilience  ADL's:  Impaired  Cognition:  Impaired,  Moderate  Sleep:       Treatment Plan Summary: Daily contact with patient to assess and evaluate symptoms and progress in treatment and Medication management  Observation Level/Precautions:  15 minute checks  Laboratory:  UDS  Psychotherapy: Reality based  Medications: Begin antipsychotic therapy  Consultations: CT scan negative  Discharge Concerns: Housing/diagnostic clarity/long-term stability and abstinence from drugs  Estimated LOS: 7-10  Other: 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 Recent substance abuse in the context of negative drug screen on presentation Axis II defer Axis III medically no acute findings but history of failure to thrive when depressed  Begin low-dose clonazepam in the short run for calming purposes, begin olanzapine as antipsychotic monitor for withdrawal discussed full treatment with team   Physician Treatment Plan for Primary Diagnosis: Bizarre behavior Long Term Goal(s): Improvement in symptoms so as ready for discharge  Short Term Goals: Ability to identify changes in lifestyle to  reduce recurrence of condition will improve, Ability to verbalize feelings will improve, Ability to disclose and discuss suicidal ideas, Ability to identify and develop effective coping behaviors will improve, Ability to maintain clinical measurements within normal limits will improve, Compliance with prescribed medications will improve and Ability to identify triggers associated with substance abuse/mental health issues will improve  Physician Treatment Plan for Secondary Diagnosis: Principal Problem:   Bizarre behavior Active Problems:   Psychosis ([facility])  Long Term Goal(s): Improvement in symptoms so as ready for discharge  Short Term Goals: Ability to identify changes in lifestyle to reduce recurrence of condition will improve, Ability to verbalize feelings will improve, Ability to disclose and discuss suicidal ideas, Ability to demonstrate self-control will improve, Ability to identify and develop effective coping behaviors will improve, Ability to maintain clinical measurements within normal limits will improve and Compliance with prescribed medications will improve  I certify that inpatient services furnished can reasonably be expected to improve the patient's condition.    Malvin Johns, MD 4/22/202111:24 AM

## 2019-07-06 NOTE — Progress Notes (Signed)
[place] NOVEL CORONAVIRUS (COVID-19) DAILY CHECK-OFF SYMPTOMS - answer yes or no to each - every day NO YES  Have you had a fever in the past 24 hours?  . Fever (Temp > 37.80C / 100F) X   Have you had any of these symptoms in the past 24 hours? . New Cough .  Sore Throat  .  Shortness of Breath .  Difficulty Breathing .  Unexplained Body Aches   X   Have you had any one of these symptoms in the past 24 hours not related to allergies?   . Runny Nose .  Nasal Congestion .  Sneezing   X   If you have had runny nose, nasal congestion, sneezing in the past 24 hours, has it worsened?  X   EXPOSURES - check yes or no X   Have you traveled outside the state in the past 14 days?  X   Have you been in contact with someone with a confirmed diagnosis of COVID-19 or PUI in the past 14 days without wearing appropriate PPE?  X   Have you been living in the same home as a person with confirmed diagnosis of COVID-19 or a PUI (household contact)?    X   Have you been diagnosed with COVID-19?    X              What to do next: Answered NO to all: Answered YES to anything:   Proceed with unit schedule Follow the [facility] Inpatient Flowsheet.   Pt visible in bed on initial approach. Denies SI, HI, AVH and pain when assessed. However, pt observed with mild confusion, A & O to self, delusional, tangential with pressuredspeech, fidgety / restless and pacing the hall on interactions. Pt has been intrusive towards staff, peers and properties. Took staff sweater to his room and wore it. Requires multiple verbal redirections for going into peers' rooms. Compliant with medications when offered. Denies adverse drug reactions. Tolerates all PO intake well when offered. Safety maintained at Q 15 minutes intervals without self harm gestures.

## 2019-07-07 MED ORDER — OLANZAPINE 10 MG PO TBDP
10.0000 mg | ORAL_TABLET | Freq: Every day | ORAL | Status: DC
  Filled 2019-07-07: qty 1

## 2019-07-07 MED ORDER — OLANZAPINE 5 MG PO TBDP
5.0000 mg | ORAL_TABLET | ORAL | Status: AC
  Administered 2019-07-07: 09:00:00 5 mg via ORAL
  Filled 2019-07-07 (×2): qty 1

## 2019-07-07 MED ORDER — OLANZAPINE 10 MG IM SOLR
INTRAMUSCULAR | Status: AC
  Administered 2019-07-07: 12:00:00 10 mg
  Filled 2019-07-07: qty 10

## 2019-07-07 MED ORDER — OLANZAPINE 10 MG PO TBDP
20.0000 mg | ORAL_TABLET | Freq: Every day | ORAL | Status: DC
  Administered 2019-07-07 – 2019-07-08 (×2): 20 mg via ORAL
  Filled 2019-07-07 (×4): qty 2

## 2019-07-07 MED ORDER — CLONAZEPAM 0.5 MG PO TABS: 0.5000 mg | ORAL_TABLET | Freq: Two times a day (BID) | ORAL | Status: DC | PRN

## 2019-07-07 MED ORDER — OLANZAPINE 10 MG IM SOLR
10.0000 mg | Freq: Once | INTRAMUSCULAR | Status: AC
  Filled 2019-07-07: qty 10

## 2019-07-07 MED ORDER — OLANZAPINE 10 MG IM SOLR: 10.0000 mg | Freq: Once | INTRAMUSCULAR | Status: DC

## 2019-07-07 NOTE — Progress Notes (Signed)
Pt refused Trazodone, pt stated when he takes it it makes him very fidgety and that's what happened last night.     07/07/19 2200  Psych Admission Type (Psych Patients Only)  Admission Status Involuntary  Psychosocial Assessment  Patient Complaints None  Eye Contact Brief;Avertive  Facial Expression Blank  Affect Anxious  Speech Tangential  Interaction Minimal  Motor Activity Fidgety;Restless;Pacing  Appearance/Hygiene In scrubs  Behavior Characteristics Anxious  Mood Depressed  Thought Process  Coherency Tangential;Disorganized;Flight of ideas  Content Preoccupation  Delusions None reported or observed  Hallucination None reported or observed  Judgment Limited  Confusion Mild  Danger to Self  Current suicidal ideation? Denies  Danger to Others  Danger to Others None reported or observed

## 2019-07-07 NOTE — Progress Notes (Signed)
Pt was up through the night singing in room loudly at times

## 2019-07-07 NOTE — Progress Notes (Addendum)
D: Pt. Isolated in room and needed encouragement to get morning meds. Pt. Denies SI/HI/AVH. Pt. Asked, "...does this have yellow dye in it?" Pt. Was wearing a cool weather jacket zipped all the way up to his chin. Later in the morning patient was in open areas and was social with peers and staff. A:  Patient took scheduled medicine.  Support and encouragement provided Routine safety checks conducted every 15 minutes. Patient  Informed to notify staff with any concerns.  Safety maintained. R:  No adverse drug reactions noted.  Patient contracts for safety.  Patient compliant with medication and treatment plan. Patient cooperative and calm. Patient interacts well with others on the unit.  Safety maintained.  Late note:  Pt. Started screaming in his room. Star code called, then canceled promptly.  Pt. Reported that the voices were telling him to go to the bathroom and not flush it, to get up, to go lay down. Pt. Wanted the nurse to stay in the room with him.  Pt.was directed to the day room. Doctor ordered,and pt. Was given 10 mg of Zyprexa IM. Pt.took medicine willingly.

## 2019-07-07 NOTE — Progress Notes (Signed)
Adult Psychoeducational Group Note  Date:  07/07/2019 Time:  10:49 PM  Group Topic/Focus:  Wrap-Up Group:   The focus of this group is to help patients review their daily goal of treatment and discuss progress on daily workbooks.  Participation Level:  Minimal  Participation Quality:  Appropriate  Affect:  Flat and Irritable  Cognitive:  Disorganized and Confused  Insight: Limited  Engagement in Group:  Limited  Modes of Intervention:  Discussion  Additional Comments:   Pt stated his goal for today was to focus on his treatment plan. Pt stated he felt he accomplished his goal today. Pt stated he felt his relationship with his family has improved since he was admitted here. Pt stated been able to contact his girlfriend and his father today improved his day. Pt stated he felt better about himself today. Pt rated his over all day on a 6 out of 10. Pt stated his appetite was pretty good today. Pt stated his slept last night was poor. Pt nurse was made aware of the situation. Pt stated he was in some physical pain. Writer asked him where was the pain, and to rate it. Pt stated he was having pain in his right and left feet and his back. Pt stated his pain level was and 8. Pt nurse was made aware of the situation. Pt deny auditory or visual hallucinations. Pt denies thoughts of harming himself or others. Pt stated that he would alert staff if anything changes.  Joshua Mcdowell 07/07/2019, 10:49 PM

## 2019-07-07 NOTE — Tx Team (Signed)
Interdisciplinary Treatment and Diagnostic Plan Update  07/07/2019 Time of Session: 9:00am Joshua Mcdowell MRN: [id number]  Principal Diagnosis: Bizarre behavior  Secondary Diagnoses: Principal Problem:   Bizarre behavior Active Problems:   Psychosis ([place])   Acute psychosis ([place])   Current Medications:  Current Facility-Administered Medications  Medication Dose Route Frequency Provider Last Rate Last Admin  . acetaminophen (TYLENOL) tablet 650 mg  650 mg Oral Q6H PRN Johnn Hai, MD      . alum & mag hydroxide-simeth (MAALOX/MYLANTA) 200-200-20 MG/5ML suspension 30 mL  30 mL Oral Q4H PRN Johnn Hai, MD      . clonazePAM Bobbye [place]) tablet 0.5 mg  0.5 mg Oral BID Johnn Hai, MD   0.5 mg at 07/07/19 0809  . clonazePAM (KLONOPIN) tablet 2 mg  2 mg Oral QHS Johnn Hai, MD   2 mg at 07/06/19 2056  . haloperidol (HALDOL) tablet 10 mg  10 mg Oral Q6H PRN Johnn Hai, MD   10 mg at 07/06/19 1220   Or  . haloperidol lactate (HALDOL) injection 10 mg  10 mg Intramuscular Q6H PRN Johnn Hai, MD      . hydrOXYzine (ATARAX/VISTARIL) tablet 25 mg  25 mg Oral TID PRN Anike, Adaku C, NP   25 mg at 07/06/19 0317  . magnesium hydroxide (MILK OF MAGNESIA) suspension 30 mL  30 mL Oral Daily PRN Johnn Hai, MD      . Derrill Memo ON 07/08/2019] OLANZapine zydis (ZYPREXA) disintegrating tablet 10 mg  10 mg Oral Daily Sharma Covert, MD      . OLANZapine zydis (ZYPREXA) disintegrating tablet 20 mg  20 mg Oral QHS Sharma Covert, MD      . traZODone (DESYREL) tablet 100 mg  100 mg Oral QHS PRN Johnn Hai, MD   100 mg at 07/06/19 2056   PTA Medications: No medications prior to admission.    Patient Stressors: Health problems Marital or family conflict Substance abuse  Patient Strengths: Ability for insight Average or above average intelligence Supportive family/friends  Treatment Modalities: Medication Management, Group therapy, Case management,  1 to 1 session with clinician,  Psychoeducation, Recreational therapy.   Physician Treatment Plan for Primary Diagnosis: Bizarre behavior Long Term Goal(s): Improvement in symptoms so as ready for discharge Improvement in symptoms so as ready for discharge   Short Term Goals: Ability to identify changes in lifestyle to reduce recurrence of condition will improve Ability to verbalize feelings will improve Ability to disclose and discuss suicidal ideas Ability to identify and develop effective coping behaviors will improve Ability to maintain clinical measurements within normal limits will improve Compliance with prescribed medications will improve Ability to identify triggers associated with substance abuse/mental health issues will improve Ability to identify changes in lifestyle to reduce recurrence of condition will improve Ability to verbalize feelings will improve Ability to disclose and discuss suicidal ideas Ability to demonstrate self-control will improve Ability to identify and develop effective coping behaviors will improve Ability to maintain clinical measurements within normal limits will improve Compliance with prescribed medications will improve  Medication Management: Evaluate patient's response, side effects, and tolerance of medication regimen.  Therapeutic Interventions: 1 to 1 sessions, Unit Group sessions and Medication administration.  Evaluation of Outcomes: Not Met  Physician Treatment Plan for Secondary Diagnosis: Principal Problem:   Bizarre behavior Active Problems:   Psychosis ([place])   Acute psychosis ([place])  Long Term Goal(s): Improvement in symptoms so as ready for discharge Improvement in symptoms so as ready for discharge  Short Term Goals: Ability to identify changes in lifestyle to reduce recurrence of condition will improve Ability to verbalize feelings will improve Ability to disclose and discuss suicidal ideas Ability to identify and develop effective coping behaviors will  improve Ability to maintain clinical measurements within normal limits will improve Compliance with prescribed medications will improve Ability to identify triggers associated with substance abuse/mental health issues will improve Ability to identify changes in lifestyle to reduce recurrence of condition will improve Ability to verbalize feelings will improve Ability to disclose and discuss suicidal ideas Ability to demonstrate self-control will improve Ability to identify and develop effective coping behaviors will improve Ability to maintain clinical measurements within normal limits will improve Compliance with prescribed medications will improve     Medication Management: Evaluate patient's response, side effects, and tolerance of medication regimen.  Therapeutic Interventions: 1 to 1 sessions, Unit Group sessions and Medication administration.  Evaluation of Outcomes: Not Met   RN Treatment Plan for Primary Diagnosis: Bizarre behavior Long Term Goal(s): Knowledge of disease and therapeutic regimen to maintain health will improve  Short Term Goals: Ability to verbalize frustration and anger appropriately will improve, Ability to identify and develop effective coping behaviors will improve and Compliance with prescribed medications will improve  Medication Management: RN will administer medications as ordered by provider, will assess and evaluate patient's response and provide education to patient for prescribed medication. RN will report any adverse and/or side effects to prescribing provider.  Therapeutic Interventions: 1 on 1 counseling sessions, Psychoeducation, Medication administration, Evaluate responses to treatment, Monitor vital signs and CBGs as ordered, Perform/monitor CIWA, COWS, AIMS and Fall Risk screenings as ordered, Perform wound care treatments as ordered.  Evaluation of Outcomes: Not Met   LCSW Treatment Plan for Primary Diagnosis: Bizarre behavior Long Term  Goal(s): Safe transition to appropriate next level of care at discharge, Engage patient in therapeutic group addressing interpersonal concerns.  Short Term Goals: Engage patient in aftercare planning with referrals and resources, Increase ability to appropriately verbalize feelings, Identify triggers associated with mental health/substance abuse issues and Increase skills for wellness and recovery  Therapeutic Interventions: Assess for all discharge needs, 1 to 1 time with Social worker, Explore available resources and support systems, Assess for adequacy in community support network, Educate family and significant other(s) on suicide prevention, Complete Psychosocial Assessment, Interpersonal group therapy.  Evaluation of Outcomes: Not Met  Progress in Treatment: Attending groups: No. Recently admitted. Participating in groups: No. Taking medication as prescribed: Yes. Toleration medication: Yes. Family/Significant other contact made: No, will contact:  supports if consents are granted. Patient understands diagnosis: No. Discussing patient identified problems/goals with staff: No. Medical problems stabilized or resolved: Yes. Denies suicidal/homicidal ideation: Yes. Issues/concerns per patient self-inventory: Yes.  New problem(s) identified: Yes, Describe:  polysubstance use  New Short Term/Long Term Goal(s): detox, medication management for mood stabilization; elimination of SI thoughts; development of comprehensive mental wellness/sobriety plan.  Patient Goals:  Patient invited to participate in treatment team, declined.  Discharge Plan or Barriers: Recently admitted to unit, CSW assessing for appropriate referrals.   Reason for Continuation of Hospitalization: Anxiety Depression Medication stabilization Withdrawal symptoms  Estimated Length of Stay: 3-5 days  Attendees: Patient: 07/07/2019 10:44 AM  Physician: Queen Blossom 07/07/2019 10:44 AM  Nursing:  07/07/2019 10:44 AM  RN  Care Manager: 07/07/2019 10:44 AM  Social Worker: Stephanie Acre, [place] 07/07/2019 10:44 AM  Recreational Therapist:  07/07/2019 10:44 AM  Other:  07/07/2019 10:44 AM  Other:  07/07/2019 10:44 AM  Other: 07/07/2019 10:44 AM    Scribe for Treatment Team: Joellen Jersey, [place] 07/07/2019 10:44 AM

## 2019-07-07 NOTE — Progress Notes (Signed)
Recreation Therapy Notes  INPATIENT RECREATION THERAPY ASSESSMENT  Patient Details Name: Joshua Mcdowell MRN: [id number] DOB: Nov 21, 1980 Today's Date: 07/07/2019       Information Obtained From: Patient  Able to Participate in Assessment/Interview: Yes  Patient Presentation: Alert  Reason for Admission (Per Patient): Other (Comments)(Pt stated his mind and body)  Patient Stressors: Other (Comment)(Bills)  Coping Skills:   Isolation, Write, TV, Sports, Music, Exercise, Art, Talk, Prayer, Avoidance, Hot Bath/Shower  Leisure Interests (2+):  Games - Video games, Sports - Other (Comment), Individual - Other (Comment)(Watch movies; Hockey)  Frequency of Recreation/Participation: Other (Comment)(Daily)  Awareness of Community Resources:  No  Expressed Interest in [employer] Information: No  County of Residence:  [place]  Patient Main Form of Transportation: Other (Comment)(Someone else)  Patient Strengths:  "Everything"  Patient Identified Areas of Improvement:  "Not being an asshole, relax"  Patient Goal for Hospitalization:  "get better and not come back"  Current SI (including self-harm):  No  Current HI:  No  Current AVH: No  Staff Intervention Plan: Group Attendance, Collaborate with Interdisciplinary Treatment Team  Consent to Intern Participation: N/A    Caroll Rancher, LRT/CTRS Caroll Rancher A 07/07/2019, 12:14 PM

## 2019-07-07 NOTE — Progress Notes (Signed)
Recreation Therapy Notes  Date: 4.23.21 Time: 1000 Location: [address] Dayroom   Group Topic: Leisure Education  Goal Area(s) Addresses:  Patient will identify positive leisure activities.  Patient will identify one positive benefit of participation in leisure activities.   Behavioral Response: Engaged  Intervention: Leisure Group Game  Activity: Patients, MHT, and LRT participated in Stage manager.  Each person was to pick a word out of the container.  They had one minute to draw it on the board.  The remaining group members were to guess the picture.  The person who guesses the picture gets the next turn.  Education:  Leisure Education, Building control surveyor  Education Outcome: Acknowledges education/In group clarification offered/Needs additional education  Clinical Observations/Feedback: Patient appeared flat but still engaged with activity.  Pt interacted well with peers.  Pt was appropriate during group session.    Caroll Rancher, LRT/CTRS    Caroll Rancher A 07/07/2019 12:04 PM

## 2019-07-07 NOTE — Progress Notes (Signed)
Pt up stating he can't go to sleep. Pt offered Trazodone- pt stated he was allergic to it. Pt offered Haldol pt stated he was allergic. Pt encouraged to talk to the doctor tomorrow to help get something to help him sleep

## 2019-07-07 NOTE — Progress Notes (Signed)
[facility] MD Progress Note  07/07/2019 4:09 PM Joshua Mcdowell  MRN:  [id number] Subjective: Patient is a 39 year old male with a past psychiatric history significant for polysubstance dependence who presented to the [facility] emergency department on 07/04/2019 after being found naked in his yard.  He was brought in by police.  The patient was noted to be responding to internal stimuli and quite disorganized.  Objective: Patient is seen and examined.  Patient is a 39 year old male with the above-stated past psychiatric history who is seen in follow-up.  Patient was significantly agitated early this morning and required a as needed dosage of Geodon.  Prior to this he was complaining of auditory hallucinations and being "tortured".  This afternoon and evaluation he admits to auditory hallucinations he continues to be paranoid.  He stated he does not understand what is going on.  We discussed substance use prior to this event, and he stated that he was only using "opiates".  Reviewed the electronic medical record as well as the patient admitted that in the past he had used heroin, marijuana, methamphetamines.  His last admission to our facility was in July 2020, and he had admitted secondary to depression and opiate dependency.  At that time he was noted in his drug screen to have alcohol, cocaine and heroin in his system.  Denied suicidal or homicidal ideation.  His vital signs are stable, he is afebrile.  He slept 6.25 hours last night.  Review of his laboratories revealed essentially normal electrolytes, a CK of 68, CBC that was normal.  Acetaminophen and salicylate were both negative.  His urinalysis was significant for ketones, protein.  His blood alcohol was less than 10.  Drug screen was negative.  A CT scan of the head was essentially negative.  His EKG showed a normal sinus rhythm with a normal QTc interval.  Principal Problem: Bizarre behavior Diagnosis: Principal Problem:  Bizarre behavior Active Problems:   Psychosis (HCC)   Acute psychosis (HCC)  Total Time spent with patient: 20 minutes  Past Psychiatric History: see admission H&P  Past Medical History:  Past Medical History:  Diagnosis Date  . Anxiety   . Depression     Past Surgical History:  Procedure Laterality Date  . ORIF ANKLE FRACTURE    . WISDOM TOOTH EXTRACTION     Family History: History reviewed. No pertinent family history. Family Psychiatric  History: See admission H&P Social History:  Social History   Substance and Sexual Activity  Alcohol Use Yes     Social History   Substance and Sexual Activity  Drug Use Yes  . Types: Cocaine, Heroin    Social History   Socioeconomic History  . Marital status: Single    Spouse name: Not on file  . Number of children: Not on file  . Years of education: Not on file  . Highest education level: Not on file  Occupational History  . Not on file  Tobacco Use  . Smoking status: Current Every Day Smoker    Types: Cigarettes  . Smokeless tobacco: Never Used  Substance and Sexual Activity  . Alcohol use: Yes  . Drug use: Yes    Types: Cocaine, Heroin  . Sexual activity: Not on file    Comment: UTA  Other Topics Concern  . Not on file  Social History Narrative  . Not on file   Social Determinants of Health   Financial Resource Strain:   . Difficulty of Paying Living Expenses:  Food Insecurity:   . Worried About Programme researcher, broadcasting/film/video in the Last Year:   . Barista in the Last Year:   Transportation Needs:   . Freight forwarder (Medical):   Marland Kitchen Lack of Transportation (Non-Medical):   Physical Activity:   . Days of Exercise per Week:   . Minutes of Exercise per Session:   Stress:   . Feeling of Stress :   Social Connections:   . Frequency of Communication with Friends and Family:   . Frequency of Social Gatherings with Friends and Family:   . Attends Religious Services:   . Active Member of Clubs or  Organizations:   . Attends Banker Meetings:   Marland Kitchen Marital Status:    Additional Social History:                         Sleep: Good  Appetite:  Good  Current Medications: Current Facility-Administered Medications  Medication Dose Route Frequency Provider Last Rate Last Admin  . acetaminophen (TYLENOL) tablet 650 mg  650 mg Oral Q6H PRN Malvin Johns, MD      . alum & mag hydroxide-simeth (MAALOX/MYLANTA) 200-200-20 MG/5ML suspension 30 mL  30 mL Oral Q4H PRN Malvin Johns, MD      . clonazePAM Scarlette Calico) tablet 0.5 mg  0.5 mg Oral BID Malvin Johns, MD   0.5 mg at 07/07/19 0809  . clonazePAM (KLONOPIN) tablet 2 mg  2 mg Oral QHS Malvin Johns, MD   2 mg at 07/06/19 2056  . haloperidol (HALDOL) tablet 10 mg  10 mg Oral Q6H PRN Malvin Johns, MD   10 mg at 07/06/19 1220   Or  . haloperidol lactate (HALDOL) injection 10 mg  10 mg Intramuscular Q6H PRN Malvin Johns, MD      . hydrOXYzine (ATARAX/VISTARIL) tablet 25 mg  25 mg Oral TID PRN Anike, Adaku C, NP   25 mg at 07/06/19 0317  . magnesium hydroxide (MILK OF MAGNESIA) suspension 30 mL  30 mL Oral Daily PRN Malvin Johns, MD      . Melene Muller ON 07/08/2019] OLANZapine zydis (ZYPREXA) disintegrating tablet 10 mg  10 mg Oral Daily Antonieta Pert, MD      . OLANZapine zydis (ZYPREXA) disintegrating tablet 20 mg  20 mg Oral QHS Antonieta Pert, MD      . traZODone (DESYREL) tablet 100 mg  100 mg Oral QHS PRN Malvin Johns, MD   100 mg at 07/06/19 2056    Lab Results:  Results for orders placed or performed during the hospital encounter of 07/06/19 (from the past 48 hour(s))  Respiratory Panel by RT PCR (Flu A&B, Covid) - Nasopharyngeal Swab     Status: None   Collection Time: 07/06/19  1:15 AM   Specimen: Nasopharyngeal Swab  Result Value Ref Range   SARS Coronavirus 2 by RT PCR NEGATIVE NEGATIVE    Comment: (NOTE) SARS-CoV-2 target nucleic acids are NOT DETECTED. The SARS-CoV-2 RNA is generally detectable in upper  respiratoy specimens during the acute phase of infection. The lowest concentration of SARS-CoV-2 viral copies this assay can detect is 131 copies/mL. A negative result does not preclude SARS-Cov-2 infection and should not be used as the sole basis for treatment or other patient management decisions. A negative result may occur with  improper specimen collection/handling, submission of specimen other than nasopharyngeal swab, presence of viral mutation(s) within the areas targeted by this assay, and inadequate number  of viral copies (<131 copies/mL). A negative result must be combined with clinical observations, patient history, and epidemiological information. The expected result is Negative. Fact Sheet for Patients:  PinkCheek.be Fact Sheet for Healthcare Providers:  GravelBags.it This test is not yet ap proved or cleared by the [place] FDA and  has been authorized for detection and/or diagnosis of SARS-CoV-2 by FDA under an Emergency Use Authorization (EUA). This EUA will remain  in effect (meaning this test can be used) for the duration of the COVID-19 declaration under Section 564(b)(1) of the Act, 21 U.S.C. section 360bbb-3(b)(1), unless the authorization is terminated or revoked sooner.    Influenza A by PCR NEGATIVE NEGATIVE   Influenza B by PCR NEGATIVE NEGATIVE    Comment: (NOTE) The Xpert Xpress SARS-CoV-2/FLU/RSV assay is intended as an aid in  the diagnosis of influenza from Nasopharyngeal swab specimens and  should not be used as a sole basis for treatment. Nasal washings and  aspirates are unacceptable for Xpert Xpress SARS-CoV-2/FLU/RSV  testing. Fact Sheet for Patients: PinkCheek.be Fact Sheet for Healthcare Providers: GravelBags.it This test is not yet approved or cleared by the [place] FDA and  has been authorized for detection and/or  diagnosis of SARS-CoV-2 by  FDA under an Emergency Use Authorization (EUA). This EUA will remain  in effect (meaning this test can be used) for the duration of the  Covid-19 declaration under Section 564(b)(1) of the Act, 21  U.S.C. section 360bbb-3(b)(1), unless the authorization is  terminated or revoked. Performed at [facility], [place] [address], [place], [place] [address]     Blood Alcohol level:  Lab Results  Component Value Date   ETH <10 07/05/2019   ETH <10 61/44/3154    Metabolic Disorder Labs: No results found for: HGBA1C, MPG No results found for: PROLACTIN No results found for: CHOL, TRIG, HDL, CHOLHDL, VLDL, LDLCALC  Physical Findings: AIMS: Facial and Oral Movements Muscles of Facial Expression: None, normal Lips and Perioral Area: None, normal Jaw: None, normal Tongue: None, normal,Extremity Movements Upper (arms, wrists, hands, fingers): None, normal Lower (legs, knees, ankles, toes): None, normal, Trunk Movements Neck, shoulders, hips: None, normal, Overall Severity Severity of abnormal movements (highest score from questions above): None, normal Incapacitation due to abnormal movements: None, normal Patient's awareness of abnormal movements (rate only patient's report): No Awareness, Dental Status Current problems with teeth and/or dentures?: No Does patient usually wear dentures?: No  CIWA:    COWS:     Musculoskeletal: Strength & Muscle Tone: within normal limits Gait & Station: normal Patient leans: N/A  Psychiatric Specialty Exam: Physical Exam  Nursing note and vitals reviewed. Constitutional: He is oriented to person, place, and time. He appears well-developed and well-nourished.  HENT:  Head: Normocephalic and atraumatic.  Respiratory: Effort normal.  Neurological: He is alert and oriented to person, place, and time.    Review of Systems  Blood pressure (!) 127/92, pulse 99, temperature 98 F (36.7 C), temperature  source Oral, resp. rate 16, height 6' (1.829 m), weight 69.9 kg.Body mass index is 20.89 kg/m.  General Appearance: Disheveled  Eye Contact:  Fair  Speech:  Pressured  Volume:  Normal  Mood:  Anxious and Dysphoric  Affect:  Labile  Thought Process:  Coherent, Goal Directed and Descriptions of Associations: Loose  Orientation:  Full (Time, Place, and Person)  Thought Content:  Delusions and Hallucinations: Auditory  Suicidal Thoughts:  No  Homicidal Thoughts:  No  Memory:  Immediate;  Poor Recent;   Poor Remote;   Poor  Judgement:  Intact  Insight:  Fair  Psychomotor Activity:  Increased  Concentration:  Concentration: Fair and Attention Span: Fair  Recall:  [employer] of Knowledge:  Fair  Language:  Good  Akathisia:  Negative  Handed:  Right  AIMS (if indicated):     Assets:  Desire for Improvement Resilience  ADL's:  Impaired  Cognition:  WNL  Sleep:  Number of Hours: 6.25     Treatment Plan Summary: Daily contact with patient to assess and evaluate symptoms and progress in treatment, Medication management and Plan : Patient is seen and examined.  Patient is a 39 year old male with the above-stated past psychiatric history is seen in follow-up.   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  At least according to the notes the patient seems to be slowly improving.  He continues to have hallucinations and delusional thinking.  He continues on clonazepam 0.5 mg p.o. twice daily and 2 mg at at bedtime, hydroxyzine 25 mg p.o. 3 times daily as needed, Zyprexa which will be increased today as well as trazodone.  It does not appear that he is in alcohol withdrawal, so I am going to change his clonazepam to 0.5 mg p.o. twice daily as needed, but we will continue to 2 mg at bedtime for now.  Hopefully we can  get rid of that prior to discharge.  Otherwise I will change his olanzapine to 10 mg p.o. daily and 20 mg p.o. nightly to assist with his psychosis and hopefully sleep.  He did have ketones and protein in his urine.  He does have a history of kidney stones.  He also has a history of seizures apparently from withdrawal syndrome.  I will also place him on seizure precautions.  1.  Change clonazepam to 0.5 mg p.o. twice daily as needed anxiety or agitation. 2.  Continue clonazepam 2 mg p.o. nightly for now.  This is for sleep. 3.  Continue hydroxyzine 25 mg p.o. 3 times daily as needed anxiety. 4.  Change Zyprexa Zydis to 10 mg p.o. daily and 20 mg p.o. nightly for psychosis and mood stability. 5.  Continue trazodone 100 mg p.o. nightly as needed insomnia. 6.  Repeat urinalysis. 7.  Placed on seizure precautions. 8.  Disposition planning-in progress.  Antonieta Pert, MD 07/07/2019, 4:09 PM

## 2019-07-08 MED ORDER — LORAZEPAM 1 MG PO TABS
1.0000 mg | ORAL_TABLET | Freq: Three times a day (TID) | ORAL | Status: DC
  Administered 2019-07-08 – 2019-07-12 (×12): 1 mg via ORAL
  Filled 2019-07-08 (×2): qty 1
  Filled 2019-07-08: qty 2
  Filled 2019-07-08 (×10): qty 1

## 2019-07-08 MED ORDER — OXCARBAZEPINE 150 MG PO TABS
150.0000 mg | ORAL_TABLET | Freq: Two times a day (BID) | ORAL | Status: DC
  Administered 2019-07-08 (×2): 150 mg via ORAL
  Filled 2019-07-08 (×6): qty 1

## 2019-07-08 MED ORDER — TRAZODONE HCL 150 MG PO TABS
150.0000 mg | ORAL_TABLET | Freq: Every evening | ORAL | Status: DC | PRN
  Administered 2019-07-08: 150 mg via ORAL
  Filled 2019-07-08 (×3): qty 1

## 2019-07-08 MED ORDER — OLANZAPINE 5 MG PO TBDP
15.0000 mg | ORAL_TABLET | Freq: Every day | ORAL | Status: DC
  Administered 2019-07-08: 09:00:00 15 mg via ORAL
  Filled 2019-07-08 (×3): qty 3

## 2019-07-08 NOTE — Progress Notes (Signed)
Pt up to the nursing station asking to speak to the warden . Pt informed he was not in jail and there was no warden here. Pt was informed the doctor will be here this morning.

## 2019-07-08 NOTE — Progress Notes (Signed)
Pt continues to yell in his room, pt informed that he need sto respect the other patients trying to sleep and not to disturb them

## 2019-07-08 NOTE — Progress Notes (Signed)
Adult Psychoeducational Group Note  Date:  07/08/2019 Time:  10:21 PM  Group Topic/Focus:  Wrap-Up Group:   The focus of this group is to help patients review their daily goal of treatment and discuss progress on daily workbooks.  Participation Level:  Minimal  Participation Quality:  Appropriate  Affect:  Appropriate  Cognitive:  Appropriate  Insight: Appropriate  Engagement in Group:  Developing/Improving  Modes of Intervention:  Discussion  Additional Comments: Pt stated his goal for today was to focus on his treatment plan. Pt stated he felt he accomplished his goal today. Pt stated he felt his relationship with his family has improved since he was admitted here. Pt stated been able to contact his girlfriend today improved his day. Pt stated been able to shaved tonight help his day as well.  Pt stated he felt better about himself today. Pt rated his overall day on a 9 out of 10. Pt stated his appetite was pretty good today. Pt stated his slept last night was poor. Pt nurse was made aware of the situation. Pt stated he was in physical pain. Pt stated his right ankle was sore. Pt rated it a 7 out of 10. Pt nurse was made aware of the situation. Pt deny auditory or visual hallucinations. Pt denies thoughts of harming himself or others. Pt stated that he would alert staff if anything changes.   Felipa Furnace 07/08/2019, 10:21 PM

## 2019-07-08 NOTE — Progress Notes (Signed)
[facility] MD Progress Note  07/08/2019 10:31 AM Joshua Mcdowell  MRN:  [id number] Subjective:  Patient is a 39 year old male with a past psychiatric history significant for polysubstance dependence who presented to the [facility] emergency department on 07/04/2019 after being found naked in his yard.  He was brought in by police.  The patient was noted to be responding to internal stimuli and quite disorganized.  Objective: Patient is seen and examined.  Patient is a 39 year old male with the above-stated past psychiatric history who is seen in follow-up.  He continues to be paranoid and delusional.  He continues to have episodes of yelling.  Clearly the milieu has been more labile than usual.  He stated this morning that he is no longer hearing voices, but is still not sleeping, and feels "tortured".  He stated that he prefers not to take Haldol because it "makes me roll up in a ball", and he also stated that "trazodone does not help me sleep".  His blood pressure is still mildly elevated.  It is 135/92, and repeat was 131/97.  His heart rate was initially 97, but accelerated to 130.  He only slept 2.25 hours last night.  Review of his lab work revealed no new laboratories.  He denied any suicidal or homicidal ideation, but continues to be guarded and paranoid.  Principal Problem: Bizarre behavior Diagnosis: Principal Problem:   Bizarre behavior Active Problems:   Psychosis (HCC)   Acute psychosis (HCC)  Total Time spent with patient: 20 minutes  Past Psychiatric History: See admission H&P  Past Medical History:  Past Medical History:  Diagnosis Date  . Anxiety   . Depression     Past Surgical History:  Procedure Laterality Date  . ORIF ANKLE FRACTURE    . WISDOM TOOTH EXTRACTION     Family History: History reviewed. No pertinent family history. Family Psychiatric  History: See admission H&P Social History:  Social History   Substance and Sexual Activity   Alcohol Use Yes     Social History   Substance and Sexual Activity  Drug Use Yes  . Types: Cocaine, Heroin    Social History   Socioeconomic History  . Marital status: Single    Spouse name: Not on file  . Number of children: Not on file  . Years of education: Not on file  . Highest education level: Not on file  Occupational History  . Not on file  Tobacco Use  . Smoking status: Current Every Day Smoker    Types: Cigarettes  . Smokeless tobacco: Never Used  Substance and Sexual Activity  . Alcohol use: Yes  . Drug use: Yes    Types: Cocaine, Heroin  . Sexual activity: Not on file    Comment: UTA  Other Topics Concern  . Not on file  Social History Narrative  . Not on file   Social Determinants of Health   Financial Resource Strain:   . Difficulty of Paying Living Expenses:   Food Insecurity:   . Worried About Programme researcher, broadcasting/film/video in the Last Year:   . Barista in the Last Year:   Transportation Needs:   . Freight forwarder (Medical):   Marland Kitchen Lack of Transportation (Non-Medical):   Physical Activity:   . Days of Exercise per Week:   . Minutes of Exercise per Session:   Stress:   . Feeling of Stress :   Social Connections:   . Frequency of Communication with Friends  and Family:   . Frequency of Social Gatherings with Friends and Family:   . Attends Religious Services:   . Active Member of Clubs or Organizations:   . Attends Banker Meetings:   Marland Kitchen Marital Status:    Additional Social History:                         Sleep: Poor  Appetite:  Fair  Current Medications: Current Facility-Administered Medications  Medication Dose Route Frequency Provider Last Rate Last Admin  . acetaminophen (TYLENOL) tablet 650 mg  650 mg Oral Q6H PRN Malvin Johns, MD   650 mg at 07/07/19 2050  . alum & mag hydroxide-simeth (MAALOX/MYLANTA) 200-200-20 MG/5ML suspension 30 mL  30 mL Oral Q4H PRN Malvin Johns, MD      . clonazePAM Scarlette Calico)  tablet 0.5 mg  0.5 mg Oral BID PRN Antonieta Pert, MD      . clonazePAM Scarlette Calico) tablet 2 mg  2 mg Oral QHS Malvin Johns, MD   2 mg at 07/07/19 2050  . haloperidol (HALDOL) tablet 10 mg  10 mg Oral Q6H PRN Malvin Johns, MD   10 mg at 07/06/19 1220   Or  . haloperidol lactate (HALDOL) injection 10 mg  10 mg Intramuscular Q6H PRN Malvin Johns, MD   10 mg at 07/08/19 1001  . hydrOXYzine (ATARAX/VISTARIL) tablet 25 mg  25 mg Oral TID PRN Anike, Adaku C, NP   25 mg at 07/08/19 0216  . magnesium hydroxide (MILK OF MAGNESIA) suspension 30 mL  30 mL Oral Daily PRN Malvin Johns, MD      . OLANZapine zydis (ZYPREXA) disintegrating tablet 15 mg  15 mg Oral Daily Antonieta Pert, MD   15 mg at 07/08/19 0857  . OLANZapine zydis (ZYPREXA) disintegrating tablet 20 mg  20 mg Oral QHS Antonieta Pert, MD   20 mg at 07/07/19 2050  . traZODone (DESYREL) tablet 150 mg  150 mg Oral QHS PRN Antonieta Pert, MD        Lab Results: No results found for this or any previous visit (from the past 48 hour(s)).  Blood Alcohol level:  Lab Results  Component Value Date   ETH <10 07/05/2019   ETH <10 07/04/2019    Metabolic Disorder Labs: No results found for: HGBA1C, MPG No results found for: PROLACTIN No results found for: CHOL, TRIG, HDL, CHOLHDL, VLDL, LDLCALC  Physical Findings: AIMS: Facial and Oral Movements Muscles of Facial Expression: None, normal Lips and Perioral Area: None, normal Jaw: None, normal Tongue: None, normal,Extremity Movements Upper (arms, wrists, hands, fingers): None, normal Lower (legs, knees, ankles, toes): None, normal, Trunk Movements Neck, shoulders, hips: None, normal, Overall Severity Severity of abnormal movements (highest score from questions above): None, normal Incapacitation due to abnormal movements: None, normal Patient's awareness of abnormal movements (rate only patient's report): No Awareness, Dental Status Current problems with teeth and/or dentures?:  No Does patient usually wear dentures?: No  CIWA:    COWS:     Musculoskeletal: Strength & Muscle Tone: within normal limits Gait & Station: normal Patient leans: N/A  Psychiatric Specialty Exam: Physical Exam  Nursing note and vitals reviewed. Constitutional: He appears well-developed and well-nourished.  HENT:  Head: Normocephalic and atraumatic.  Respiratory: Effort normal.  Neurological: He is alert.    Review of Systems  Blood pressure (!) 131/97, pulse (!) 130, temperature 98 F (36.7 C), temperature source Oral, resp. rate 18, height  6' (1.829 m), weight 69.9 kg.Body mass index is 20.89 kg/m.  General Appearance: Disheveled  Eye Contact:  Fair  Speech:  Normal Rate  Volume:  Normal  Mood:  Anxious, Depressed and Dysphoric  Affect:  Congruent  Thought Process:  Goal Directed and Descriptions of Associations: Loose  Orientation:  Full (Time, Place, and Person)  Thought Content:  Delusions, Hallucinations: Auditory, Paranoid Ideation and Rumination  Suicidal Thoughts:  No  Homicidal Thoughts:  No  Memory:  Immediate;   Poor Recent;   Poor Remote;   Poor  Judgement:  Impaired  Insight:  Lacking  Psychomotor Activity:  Increased  Concentration:  Concentration: Fair and Attention Span: Fair  Recall:  Poor  Fund of Knowledge:  Fair  Language:  Fair  Akathisia:  Negative  Handed:  Right  AIMS (if indicated):     Assets:  Desire for Improvement Resilience  ADL's:  Impaired  Cognition:  WNL  Sleep:  Number of Hours: 2.25     Treatment Plan Summary: Daily contact with patient to assess and evaluate symptoms and progress in treatment, Medication management and Plan : Diagnosis: #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  He is essentially unchanged from yesterday.  He did deny auditory  hallucinations this morning, but continues to be guarded and delusional.  I am going to increase his Zyprexa to 15 mg p.o. daily and 20 mg p.o. nightly.  I am going to stop the Klonopin, and switch him to lorazepam 1 mg p.o. 3 times daily, and hopefully be able to wean that.  I am going to place him on Trileptal 150 mg p.o. twice daily and titrate that if necessary.  Hopefully these things will help him sleep and decrease his lability and agitation.  1.  Stop Klonopin. 2.  Start lorazepam 1 mg p.o. 3 times daily standing for now.  This is for anxiety, agitation and possible benzodiazepine withdrawal. 3.  Increase Zyprexa to 15 mg p.o. daily and 20 mg p.o. nightly. 4.  Continue trazodone 150 mg p.o. nightly as needed insomnia. 5.  Continue hydroxyzine 25 mg p.o. 3 times daily as needed anxiety. 6.  Start Trileptal 150 mg p.o. twice daily and titrate. 7.  Disposition planning-in progress.  Sharma Covert, MD 07/08/2019, 10:31 AM

## 2019-07-08 NOTE — Plan of Care (Signed)
  Problem: Coping: Goal: Ability to identify and develop effective coping behavior will improve Outcome: Not Progressing   Problem: Safety: Goal: Ability to remain free from injury will improve Outcome: Progressing   

## 2019-07-08 NOTE — [facility] Group Notes (Signed)
Adult Psychoeducational Group Note  Date:  07/08/2019 Time:  4:14 PM  Group Topic/Focus:  Goals Group:   The focus of this group is to help patients establish daily goals to achieve during treatment and discuss how the patient can incorporate goal setting into their daily lives to aide in recovery.  Participation Level:  Active  Participation Quality:  Appropriate  Affect:  Appropriate  Cognitive:  Disorganized  Insight: Lacking  Engagement in Group:  Engaged  Modes of Intervention:  Education and Support  Additional Comments:  Pt attended the group. Stated that he had multipuly things he wanted to get done. Unable to focus down  Dione Housekeeper 07/08/2019, 4:14 PM

## 2019-07-08 NOTE — Progress Notes (Signed)
   07/08/19 1035  Psych Admission Type (Psych Patients Only)  Admission Status Involuntary  Psychosocial Assessment  Patient Complaints Confusion;Irritability;Restlessness;Sleep disturbance  Eye Contact Brief  Facial Expression Blank  Affect Appropriate to circumstance  Speech Tangential;Pressured  Interaction Intrusive  Motor Activity Fidgety;Restless;Pacing  Appearance/Hygiene In scrubs  Behavior Characteristics Anxious  Mood Anxious;Labile;Preoccupied  Thought Process  Coherency Disorganized;Tangential  Content Preoccupation  Delusions None reported or observed  Perception Hallucinations  Hallucination Auditory  Judgment Impaired  Confusion Mild  Danger to Self  Current suicidal ideation? Denies  Danger to Others  Danger to Others None reported or observed

## 2019-07-08 NOTE — Progress Notes (Signed)
Pt yelling out in his room , pt needed to be redirected to not wake other patients up this morning

## 2019-07-08 NOTE — Progress Notes (Signed)
Patient visible on the unit.  Presents guarded with pressured speech, ambulatory with a steady gait. Denies SI, HI, AVH and pain at this time. Patient did endorse hearing voices prior to arrival to hospital states medications are helping. Pleasant and cooperative on approach. Patient shaved this evening and Compliant with evening medications. Denies any issues or concerns.  Pt remains on Q 15 minutes safety checks and without self harm gestures. Writer encouraged pt to voice concerns. Pt remains safe on unit.

## 2019-07-09 MED ORDER — ZIPRASIDONE MESYLATE 20 MG IM SOLR
20.0000 mg | INTRAMUSCULAR | Status: AC
  Administered 2019-07-09: 10:00:00 20 mg via INTRAMUSCULAR

## 2019-07-09 MED ORDER — QUETIAPINE FUMARATE 300 MG PO TABS
600.0000 mg | ORAL_TABLET | Freq: Every day | ORAL | Status: DC
  Administered 2019-07-09: 600 mg via ORAL
  Filled 2019-07-09 (×2): qty 2

## 2019-07-09 MED ORDER — LORAZEPAM 1 MG PO TABS
2.0000 mg | ORAL_TABLET | Freq: Once | ORAL | Status: AC
  Administered 2019-07-10: 2 mg via ORAL

## 2019-07-09 MED ORDER — ZIPRASIDONE MESYLATE 20 MG IM SOLR
INTRAMUSCULAR | Status: AC
  Filled 2019-07-09: qty 20

## 2019-07-09 MED ORDER — OXCARBAZEPINE 300 MG PO TABS
300.0000 mg | ORAL_TABLET | Freq: Two times a day (BID) | ORAL | Status: DC
  Administered 2019-07-09 (×2): 300 mg via ORAL
  Filled 2019-07-09 (×5): qty 1

## 2019-07-09 MED ORDER — QUETIAPINE FUMARATE 100 MG PO TABS
100.0000 mg | ORAL_TABLET | Freq: Every day | ORAL | Status: DC
  Administered 2019-07-09: 10:00:00 100 mg via ORAL
  Filled 2019-07-09 (×4): qty 1

## 2019-07-09 MED ORDER — QUETIAPINE FUMARATE 400 MG PO TABS
400.0000 mg | ORAL_TABLET | Freq: Every day | ORAL | Status: DC
  Filled 2019-07-09: qty 1

## 2019-07-09 MED ORDER — ZIPRASIDONE MESYLATE 20 MG IM SOLR
20.0000 mg | Freq: Four times a day (QID) | INTRAMUSCULAR | Status: DC | PRN
  Administered 2019-07-09 – 2019-07-11 (×3): 20 mg via INTRAMUSCULAR
  Filled 2019-07-09 (×3): qty 20

## 2019-07-09 NOTE — Progress Notes (Signed)
[facility] MD Progress Note  07/09/2019 11:28 AM Joshua Mcdowell  MRN:  [id number] Subjective:  Patient is a 39 year old male with a past psychiatric history significant for polysubstance dependence who presented to the [facility] emergency department on 07/04/2019 after being found naked in his yard. He was brought in by police. The patient was noted to be responding to internal stimuli and quite disorganized.  Objective: Patient is seen and examined.  Patient is a 39 year old male with the above-stated past psychiatric history who is seen in follow-up.  Unfortunately he remains paranoid delusional.  He continues to have episodes of yelling.  He has minimal insight to this.  His sleep is still poor.  His blood pressure has remained mildly elevated.  He continues to have auditory hallucinations.  No new laboratories.  Principal Problem: Bizarre behavior Diagnosis: Principal Problem:   Bizarre behavior Active Problems:   Psychosis (HCC)   Acute psychosis (HCC)  Total Time spent with patient: 20 minutes  Past Psychiatric History: The admission H&P  Past Medical History:  Past Medical History:  Diagnosis Date  . Anxiety   . Depression     Past Surgical History:  Procedure Laterality Date  . ORIF ANKLE FRACTURE    . WISDOM TOOTH EXTRACTION     Family History: History reviewed. No pertinent family history. Family Psychiatric  History: see admission H&P Social History:  Social History   Substance and Sexual Activity  Alcohol Use Yes     Social History   Substance and Sexual Activity  Drug Use Yes  . Types: Cocaine, Heroin    Social History   Socioeconomic History  . Marital status: Single    Spouse name: Not on file  . Number of children: Not on file  . Years of education: Not on file  . Highest education level: Not on file  Occupational History  . Not on file  Tobacco Use  . Smoking status: Current Every Day Smoker    Types: Cigarettes  .  Smokeless tobacco: Never Used  Substance and Sexual Activity  . Alcohol use: Yes  . Drug use: Yes    Types: Cocaine, Heroin  . Sexual activity: Not on file    Comment: UTA  Other Topics Concern  . Not on file  Social History Narrative  . Not on file   Social Determinants of Health   Financial Resource Strain:   . Difficulty of Paying Living Expenses:   Food Insecurity:   . Worried About Programme researcher, broadcasting/film/video in the Last Year:   . Barista in the Last Year:   Transportation Needs:   . Freight forwarder (Medical):   Marland Kitchen Lack of Transportation (Non-Medical):   Physical Activity:   . Days of Exercise per Week:   . Minutes of Exercise per Session:   Stress:   . Feeling of Stress :   Social Connections:   . Frequency of Communication with Friends and Family:   . Frequency of Social Gatherings with Friends and Family:   . Attends Religious Services:   . Active Member of Clubs or Organizations:   . Attends Banker Meetings:   Marland Kitchen Marital Status:    Additional Social History:                         Sleep: Poor  Appetite:  Fair  Current Medications: Current Facility-Administered Medications  Medication Dose Route Frequency Provider Last Rate Last  Admin  . acetaminophen (TYLENOL) tablet 650 mg  650 mg Oral Q6H PRN Malvin Johns, MD   650 mg at 07/07/19 2050  . alum & mag hydroxide-simeth (MAALOX/MYLANTA) 200-200-20 MG/5ML suspension 30 mL  30 mL Oral Q4H PRN Malvin Johns, MD      . haloperidol (HALDOL) tablet 10 mg  10 mg Oral Q6H PRN Malvin Johns, MD   10 mg at 07/09/19 8264   Or  . haloperidol lactate (HALDOL) injection 10 mg  10 mg Intramuscular Q6H PRN Malvin Johns, MD   10 mg at 07/08/19 1001  . hydrOXYzine (ATARAX/VISTARIL) tablet 25 mg  25 mg Oral TID PRN Anike, Adaku C, NP   25 mg at 07/08/19 2031  . LORazepam (ATIVAN) tablet 1 mg  1 mg Oral TID Antonieta Pert, MD   1 mg at 07/09/19 0837  . magnesium hydroxide (MILK OF MAGNESIA)  suspension 30 mL  30 mL Oral Daily PRN Malvin Johns, MD      . Oxcarbazepine (TRILEPTAL) tablet 300 mg  300 mg Oral BID Antonieta Pert, MD   300 mg at 07/09/19 0936  . QUEtiapine (SEROQUEL) tablet 100 mg  100 mg Oral Daily Antonieta Pert, MD   100 mg at 07/09/19 1583  . QUEtiapine (SEROQUEL) tablet 400 mg  400 mg Oral QHS Antonieta Pert, MD      . traZODone (DESYREL) tablet 150 mg  150 mg Oral QHS PRN Antonieta Pert, MD   150 mg at 07/08/19 2152    Lab Results: No results found for this or any previous visit (from the past 48 hour(s)).  Blood Alcohol level:  Lab Results  Component Value Date   ETH <10 07/05/2019   ETH <10 07/04/2019    Metabolic Disorder Labs: No results found for: HGBA1C, MPG No results found for: PROLACTIN No results found for: CHOL, TRIG, HDL, CHOLHDL, VLDL, LDLCALC  Physical Findings: AIMS: Facial and Oral Movements Muscles of Facial Expression: None, normal Lips and Perioral Area: None, normal Jaw: None, normal Tongue: None, normal,Extremity Movements Upper (arms, wrists, hands, fingers): None, normal Lower (legs, knees, ankles, toes): None, normal, Trunk Movements Neck, shoulders, hips: None, normal, Overall Severity Severity of abnormal movements (highest score from questions above): None, normal Incapacitation due to abnormal movements: None, normal Patient's awareness of abnormal movements (rate only patient's report): No Awareness, Dental Status Current problems with teeth and/or dentures?: No Does patient usually wear dentures?: No  CIWA:    COWS:     Musculoskeletal: Strength & Muscle Tone: within normal limits Gait & Station: normal Patient leans: N/A  Psychiatric Specialty Exam: Physical Exam  Nursing note and vitals reviewed. Constitutional: He is oriented to person, place, and time. He appears well-developed and well-nourished.  HENT:  Head: Normocephalic and atraumatic.  Respiratory: Effort normal.  Neurological: He  is alert and oriented to person, place, and time.    Review of Systems  Blood pressure (!) 143/93, pulse (!) 110, temperature 98 F (36.7 C), temperature source Oral, resp. rate 18, height 6' (1.829 m), weight 69.9 kg.Body mass index is 20.89 kg/m.  General Appearance: Disheveled  Eye Contact:  Fair  Speech:  Normal Rate  Volume:  Normal  Mood:  Anxious, Depressed and Dysphoric  Affect:  Labile  Thought Process:  Goal Directed and Descriptions of Associations: Loose  Orientation:  Full (Time, Place, and Person)  Thought Content:  Delusions, Hallucinations: Auditory, Ideas of Reference:   Paranoia Delusions and Paranoid Ideation  Suicidal  Thoughts:  No  Homicidal Thoughts:  No  Memory:  Immediate;   Poor Recent;   Poor Remote;   Poor  Judgement:  Impaired  Insight:  Lacking  Psychomotor Activity:  Increased  Concentration:  Concentration: Fair and Attention Span: Fair  Recall:  [employer] of Knowledge:  Fair  Language:  Good  Akathisia:  Negative  Handed:  Right  AIMS (if indicated):     Assets:  Desire for Improvement Resilience  ADL's:  Impaired  Cognition:  WNL  Sleep:  Number of Hours: 3.5     Treatment Plan Summary: Daily contact with patient to assess and evaluate symptoms and progress in treatment, Medication management and Plan : Patient is seen and examined.  Patient is a 39 year old male with the above-stated past psychiatric history who is seen in follow-up.  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  He has not responded to the Zyprexa, and I will switch to Seroquel.  We will give 100 mg p.o. daily and 600 mg p.o. nightly.  I will also increase his Trileptal to 300 mg p.o. twice daily.  He continues on  lorazepam 1 mg p.o. 3 times daily for agitation.  Hopefully the switch in medications will be effective.  1.  Continue as needed Haldol for agitation. 2.  Continue hydroxyzine 25 mg p.o. 3 times daily as needed anxiety. 3.  Continue lorazepam 1 mg p.o. 3 times daily for anxiety and agitation. 4.  Increase Trileptal to 300 mg p.o. twice daily for mood stability and decrease in agitation. 5.  Stop Zyprexa. 6.  Start Seroquel 100 mg p.o. daily and 600 mg p.o. nightly for mood stability and psychosis. 7.  Continue trazodone 150 mg p.o. nightly as needed insomnia. 8.  Continue Geodon 20 mg IM every 12 hours as needed agitation. 9.  Disposition planning-in progress. Sharma Covert, MD 07/09/2019, 11:28 AM
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
## 2019-07-09 NOTE — Plan of Care (Signed)
  Problem: Safety: Goal: Ability to remain free from injury will improve Outcome: Progressing   Problem: Activity: Goal: Will identify at least one activity in which they can participate Outcome: Progressing

## 2019-07-09 NOTE — Progress Notes (Signed)
Barbara Cower Np made aware of elevated vital signs will repeat after breakfast no further orders at this time

## 2019-07-09 NOTE — [facility] Suicide Risk Assessment (Signed)
[facility] INPATIENT:  Family/Significant Other Suicide Prevention Education  Suicide Prevention Education:  Patient Refusal for Family/Significant Other Suicide Prevention Education: The patient Joshua Mcdowell has refused to provide written consent for family/significant other to be provided Family/Significant Other Suicide Prevention Education during admission and/or prior to discharge.  Physician notified.  Evorn Gong 07/09/2019, 5:11 PM

## 2019-07-09 NOTE — Progress Notes (Signed)
   07/09/19 0900  Psych Admission Type (Psych Patients Only)  Admission Status Involuntary  Psychosocial Assessment  Patient Complaints Irritability;Insomnia  Eye Contact Brief  Affect Appropriate to circumstance  Speech Pressured;Tangential  Interaction Assertive  Motor Activity Restless  Appearance/Hygiene In scrubs  Behavior Characteristics Anxious;Restless  Thought Process  Coherency Disorganized;Tangential  Content Preoccupation  Delusions None reported or observed  Perception Hallucinations  Hallucination Auditory  Judgment Impaired  Confusion Mild  Danger to Self  Current suicidal ideation? Denies  Danger to Others  Danger to Others None reported or observed

## 2019-07-10 MED ORDER — ZIPRASIDONE HCL 40 MG PO CAPS
40.0000 mg | ORAL_CAPSULE | Freq: Every day | ORAL | Status: DC
  Administered 2019-07-10 – 2019-07-12 (×3): 40 mg via ORAL
  Filled 2019-07-10 (×7): qty 1

## 2019-07-10 MED ORDER — ZIPRASIDONE HCL 60 MG PO CAPS
60.0000 mg | ORAL_CAPSULE | Freq: Every day | ORAL | Status: DC
  Filled 2019-07-10 (×2): qty 1

## 2019-07-10 MED ORDER — ZIPRASIDONE HCL 80 MG PO CAPS
80.0000 mg | ORAL_CAPSULE | Freq: Every day | ORAL | Status: DC
  Administered 2019-07-10: 17:00:00 80 mg via ORAL
  Filled 2019-07-10 (×2): qty 1

## 2019-07-10 MED ORDER — OXCARBAZEPINE 300 MG PO TABS
600.0000 mg | ORAL_TABLET | Freq: Two times a day (BID) | ORAL | Status: DC
  Administered 2019-07-10 – 2019-07-11 (×3): 600 mg via ORAL
  Filled 2019-07-10 (×6): qty 2

## 2019-07-10 MED ORDER — TRAZODONE HCL 100 MG PO TABS
200.0000 mg | ORAL_TABLET | Freq: Every evening | ORAL | Status: DC | PRN
  Filled 2019-07-10: qty 2

## 2019-07-10 NOTE — Progress Notes (Signed)
Recreation Therapy Notes  Date: 4.26.21 Time: 1000 Location: 500 Hall Dayroom  Group Topic: Coping Skills  Goal Area(s) Addresses:  Patient will identify positive coping skills. Patient will identify benefit of using coping skills post d/c.  Behavioral Response: Minimal  Intervention: Worksheet  Activity: Mind Map.  LRT and patients filled in the first 8 boxes of the mind map with depression, boredom, loss of loved one, stress, humility, guilt, fear and loneliness.  Patients were then given time to come up with at least 3 coping skills for each instance where coping skills would be used.  Education:Coping Skills, Discharge Planning.   Education Outcome: Acknowledges understanding/In group clarification offered/Needs additional education.   Clinical Observations/Feedback: Pt expressed some coping skills as forgiving self and entertainment.    Caroll Rancher, LRT/CTRS        Caroll Rancher A 07/10/2019 12:39 PM

## 2019-07-10 NOTE — Progress Notes (Addendum)
   07/10/19 1052  Psych Admission Type (Psych Patients Only)  Admission Status Involuntary  Psychosocial Assessment  Patient Complaints Anxiety;Irritability;Restlessness  Eye Contact Brief  Facial Expression Anxious  Affect Appropriate to circumstance  Speech Tangential;Pressured  Interaction Demanding;Defensive  Motor Activity Fidgety;Restless;Pacing  Appearance/Hygiene Unremarkable  Behavior Characteristics Cooperative;Anxious;Fidgety;Impulsive  Mood Anxious;Labile  Thought Process  Coherency Tangential;Disorganized;Circumstantial  Content Preoccupation;Paranoia  Delusions Paranoid  Perception Derealization  Hallucination None reported or observed  Judgment Limited  Confusion Mild  Danger to Self  Current suicidal ideation? Denies  Danger to Others  Danger to Others None reported or observed  Danger to Others Abnormal  Harmful Behavior to others No threats or harm toward other people  Destructive Behavior No threats or harm toward property   Pt mood continues to be labile with periods of agitation, screaming aggressively pulling on the counter at the nursing station. Verba redirection was ineffective at the as pt continued to escalate. PRN Geodon 20 mg IM administered as ordered. Pt observed to be awake in bed but calm when reassessed.  Emotional support and encouragement offered. All medications administered with verbal education and effects monitored. Q 15 minutes safety checks maintained on and off unit.  Pt tolerates all PO intake well. Denies concerns at this time.

## 2019-07-10 NOTE — Progress Notes (Signed)
Pt up much of [place]. Pt c/o 'allergic reaction' of restlessness & inability to sleep to scheduled seroquel. Pt requested Geodon IM as it 'knocked me out' earlier in day. Med given as prescribed with no calming/sleep effect. Pt continued to c/o allergic reaction so vistaril was given to no effect as well. Pt requesting Ambien. Provider notified and declined to order d/t history of OD. One time order of [facility] 23m [facility] Pt did appear to sleep intermittently after but not for long periods. Needs met as able and safety maintained. Monitoring continues.  Pt reporting allergy/adverse reaction to Seroquel, Trazodone, and Haldol.

## 2019-07-10 NOTE — Progress Notes (Signed)
[facility] MD Progress Note  07/10/2019 12:25 PM Joshua Mcdowell  MRN:  [id number] Subjective:  Patient is a 39 year old male with a past psychiatric history significant for polysubstance dependence who presented to the [facility] emergency department on 07/04/2019 after being found naked in his yard. He was brought in by police. The patient was noted to be responding to internal stimuli and quite disorganized.  Objective: Patient is seen and examined.  Patient is a 39 year old male with the above-stated past psychiatric history who is seen in follow-up.  He is still not sleeping great.  He only got 3.5 hours of sleep last night, and that was despite multiple medications.  This morning on the telephone he is talking to his father and stated "I am cured".  He then complained of side effects and intolerance to multiple medications.  He did request from the nursing staff Geodon injection which he received yesterday, and we discussed the possibility of switching to oral Geodon.  He is in agreement with that.  He remains disorganized, psychotic still quite paranoid and responding to internal stimulation.  No new laboratories.  Earlier in the morning he was quite tachycardic with a rate of 153 and mildly hypertensive with a blood pressure 137/96 and 143/93.  Most recently his rate was at 110.  Principal Problem: Bizarre behavior Diagnosis: Principal Problem:   Bizarre behavior Active Problems:   Psychosis ([place])   Acute psychosis ([place])  Total Time spent with patient: 20 minutes  Past Psychiatric History: See admission H&P  Past Medical History:  Past Medical History:  Diagnosis Date  . Anxiety   . Depression     Past Surgical History:  Procedure Laterality Date  . ORIF ANKLE FRACTURE    . WISDOM TOOTH EXTRACTION     Family History: History reviewed. No pertinent family history. Family Psychiatric  History: The admission H&P Social History:  Social History   Substance  and Sexual Activity  Alcohol Use Yes     Social History   Substance and Sexual Activity  Drug Use Yes  . Types: Cocaine, Heroin    Social History   Socioeconomic History  . Marital status: Single    Spouse name: Not on file  . Number of children: Not on file  . Years of education: Not on file  . Highest education level: Not on file  Occupational History  . Not on file  Tobacco Use  . Smoking status: Current Every Day Smoker    Types: Cigarettes  . Smokeless tobacco: Never Used  Substance and Sexual Activity  . Alcohol use: Yes  . Drug use: Yes    Types: Cocaine, Heroin  . Sexual activity: Not on file    Comment: UTA  Other Topics Concern  . Not on file  Social History Narrative  . Not on file   Social Determinants of Health   Financial Resource Strain:   . Difficulty of Paying Living Expenses:   Food Insecurity:   . Worried About Charity fundraiser in the Last Year:   . Arboriculturist in the Last Year:   Transportation Needs:   . Film/video editor (Medical):   Marland Kitchen Lack of Transportation (Non-Medical):   Physical Activity:   . Days of Exercise per Week:   . Minutes of Exercise per Session:   Stress:   . Feeling of Stress :   Social Connections:   . Frequency of Communication with Friends and Family:   . Frequency  of Social Gatherings with Friends and Family:   . Attends Religious Services:   . Active Member of Clubs or Organizations:   . Attends Banker Meetings:   Marland Kitchen Marital Status:    Additional Social History:                         Sleep: Poor  Appetite:  Fair  Current Medications: Current Facility-Administered Medications  Medication Dose Route Frequency Provider Last Rate Last Admin  . acetaminophen (TYLENOL) tablet 650 mg  650 mg Oral Q6H PRN Malvin Johns, MD   650 mg at 07/07/19 2050  . alum & mag hydroxide-simeth (MAALOX/MYLANTA) 200-200-20 MG/5ML suspension 30 mL  30 mL Oral Q4H PRN Malvin Johns, MD      .  haloperidol (HALDOL) tablet 10 mg  10 mg Oral Q6H PRN Malvin Johns, MD   10 mg at 07/09/19 4270   Or  . haloperidol lactate (HALDOL) injection 10 mg  10 mg Intramuscular Q6H PRN Malvin Johns, MD   10 mg at 07/08/19 1001  . hydrOXYzine (ATARAX/VISTARIL) tablet 25 mg  25 mg Oral TID PRN Anike, Adaku C, NP   25 mg at 07/09/19 2234  . LORazepam (ATIVAN) tablet 1 mg  1 mg Oral TID Antonieta Pert, MD   1 mg at 07/10/19 1026  . magnesium hydroxide (MILK OF MAGNESIA) suspension 30 mL  30 mL Oral Daily PRN Malvin Johns, MD      . Oxcarbazepine (TRILEPTAL) tablet 600 mg  600 mg Oral BID Antonieta Pert, MD      . traZODone (DESYREL) tablet 200 mg  200 mg Oral QHS PRN Antonieta Pert, MD      . ziprasidone (GEODON) capsule 40 mg  40 mg Oral Q breakfast Antonieta Pert, MD   40 mg at 07/10/19 1026  . ziprasidone (GEODON) capsule 60 mg  60 mg Oral Q supper Antonieta Pert, MD      . ziprasidone (GEODON) injection 20 mg  20 mg Intramuscular Q6H PRN Antonieta Pert, MD   20 mg at 07/09/19 2154    Lab Results: No results found for this or any previous visit (from the past 48 hour(s)).  Blood Alcohol level:  Lab Results  Component Value Date   ETH <10 07/05/2019   ETH <10 07/04/2019    Metabolic Disorder Labs: No results found for: HGBA1C, MPG No results found for: PROLACTIN No results found for: CHOL, TRIG, HDL, CHOLHDL, VLDL, LDLCALC  Physical Findings: AIMS: Facial and Oral Movements Muscles of Facial Expression: None, normal Lips and Perioral Area: None, normal Jaw: None, normal Tongue: None, normal,Extremity Movements Upper (arms, wrists, hands, fingers): None, normal Lower (legs, knees, ankles, toes): None, normal, Trunk Movements Neck, shoulders, hips: None, normal, Overall Severity Severity of abnormal movements (highest score from questions above): None, normal Incapacitation due to abnormal movements: None, normal Patient's awareness of abnormal movements (rate only  patient's report): No Awareness, Dental Status Current problems with teeth and/or dentures?: No Does patient usually wear dentures?: No  CIWA:    COWS:     Musculoskeletal: Strength & Muscle Tone: within normal limits Gait & Station: normal Patient leans: N/A  Psychiatric Specialty Exam: Physical Exam  Nursing note and vitals reviewed. Constitutional: He is oriented to person, place, and time. He appears well-developed and well-nourished.  HENT:  Head: Normocephalic and atraumatic.  Respiratory: Effort normal.  Neurological: He is alert and oriented to person, place,  and time.    Review of Systems  Blood pressure (!) 143/93, pulse (!) 110, temperature 98 F (36.7 C), temperature source Oral, resp. rate 18, height 6' (1.829 m), weight 69.9 kg.Body mass index is 20.89 kg/m.  General Appearance: Disheveled  Eye Contact:  Fair  Speech:  Pressured  Volume:  Normal  Mood:  Anxious, Dysphoric and Irritable  Affect:  Labile  Thought Process:  Goal Directed and Descriptions of Associations: Loose  Orientation:  Negative  Thought Content:  Delusions, Hallucinations: Auditory, Paranoid Ideation, Rumination and Tangential  Suicidal Thoughts:  No  Homicidal Thoughts:  No  Memory:  Immediate;   Poor Recent;   Poor Remote;   Poor  Judgement:  Impaired  Insight:  Lacking  Psychomotor Activity:  Increased  Concentration:  Concentration: Fair and Attention Span: Fair  Recall:  [employer] of Knowledge:  Fair  Language:  Fair  Akathisia:  Negative  Handed:  Right  AIMS (if indicated):     Assets:  Desire for Improvement Resilience  ADL's:  Impaired  Cognition:  WNL  Sleep:  Number of Hours: 3.5     Treatment Plan Summary: Daily contact with patient to assess and evaluate symptoms and progress in treatment, Medication management and Plan : Patient is seen and examined.  Patient is a 39 year old male with the above-stated past psychiatric history is seen in follow-up.  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  He seems to be slightly better, but does not want to take Seroquel.  He has agreed to a trial of Geodon.  We will give 40 mg this morning, and 60 mg p.o. every afternoon.  I will also increase his Trileptal to 600 mg p.o. twice daily for mood stability.  I will also increase his trazodone to 200 mg p.o. nightly as needed insomnia.  1.  Continue hydroxyzine 25 mg p.o. 3 times daily as needed anxiety. 2.  Continue lorazepam 1 mg p.o. 3 times daily for anxiety and agitation. 3.  Increase Trileptal to 600 mg p.o. twice daily for mood stability and anxiety. 4.  Increase trazodone to 200 mg p.o. nightly as needed insomnia. 5.  Stop Seroquel. 6.  Start Geodon 40 mg p.o. daily and 60 mg p.o. every afternoon with food.  This is for psychosis. 7.  As needed Haldol and Ativan for agitation. 8.  Disposition planning-in progress. Antonieta Pert, MD 07/10/2019, 12:25 PM

## 2019-07-10 NOTE — Progress Notes (Signed)
GROUP WAS NOT HELD DUE TO UNIT ACUITY.   SPIRITUALITY GROUP NOTE  Spirituality group facilitated by Chaplain Ramondo Hollie Bartus, MDiv, BCC.  Group Description:  Group focused on topic of hope.  Patients participated in facilitated discussion around topic, connecting with one another around experiences and definitions for hope.  Group members engaged with visual explorer photos, reflecting on what hope looks like for them today.  Group engaged in discussion around how their definitions of hope are present today in hospital.   Modalities: Psycho-social ed, Adlerian, Narrative, MI Patient Progress:  

## 2019-07-11 MED ORDER — ZIPRASIDONE HCL 80 MG PO CAPS
80.0000 mg | ORAL_CAPSULE | Freq: Two times a day (BID) | ORAL | Status: DC
  Administered 2019-07-11 – 2019-07-14 (×6): 80 mg via ORAL
  Filled 2019-07-11 (×2): qty 14
  Filled 2019-07-11 (×3): qty 1
  Filled 2019-07-11: qty 14
  Filled 2019-07-11: qty 1
  Filled 2019-07-11: qty 14
  Filled 2019-07-11 (×4): qty 1

## 2019-07-11 MED ORDER — ZIPRASIDONE HCL 40 MG PO CAPS
40.0000 mg | ORAL_CAPSULE | ORAL | Status: AC
  Administered 2019-07-11: 11:00:00 40 mg via ORAL
  Filled 2019-07-11: qty 1

## 2019-07-11 NOTE — Progress Notes (Signed)
Adult Psychoeducational Group Note  Date:  07/11/2019 Time:  11:16 PM  Group Topic/Focus:  Wrap-Up Group:   The focus of this group is to help patients review their daily goal of treatment and discuss progress on daily workbooks.  Participation Level:  Did Not Attend  Participation Quality:  Did Not Attend  Affect:  Did Not Attend  Cognitive:  Did Not Attend  Insight: None  Engagement in Group:  Did Not Attend  Modes of Intervention:  Did Not Attend  Additional Comments:  Pt did not attend evening wrap up group tonight.  Felipa Furnace 07/11/2019, 11:16 PM

## 2019-07-11 NOTE — Progress Notes (Addendum)
   07/11/19 1058  Psych Admission Type (Psych Patients Only)  Admission Status Involuntary  Psychosocial Assessment  Patient Complaints Anxiety;Restlessness  Eye Contact Brief  Facial Expression Anxious  Affect Appropriate to circumstance  Speech Tangential;Pressured  Interaction Demanding;Defensive  Motor Activity Fidgety;Restless;Pacing  Appearance/Hygiene Unremarkable  Behavior Characteristics Fidgety;Pacing  Mood Anxious;Preoccupied  Aggressive Behavior  Targets Other (Comment) (previous notes of aggression in [facility])  Thought Process  Coherency Tangential;Disorganized;Circumstantial  Content Preoccupation;Paranoia  Delusions Paranoid  Perception Derealization  Hallucination None reported or observed  Judgment Limited  Confusion Mild  Danger to Self  Current suicidal ideation? Denies  Danger to Others  Danger to Others None reported or observed  Danger to Others Abnormal  Harmful Behavior to others No threats or harm toward other people  Destructive Behavior No threats or harm toward property   Pt A & O to self and place. Denies SI, HI, AVH and pain when assessed. However, pt observed screaming uncontrollably in his room at intervals as if he was responding to internal stimuli. Verbal redirections ineffective. PRN Geodon administered at 1412 and pt was calm and redirectable at 1512 when reassessed.  Educated on d/c procedure and prescriptions. Emotional support and encouragement provided to pt throughout this shift. Q 15 minutes safety checks maintained without outburst.  Tolerates all medications and PO intake well. Denies concerns at this time.

## 2019-07-11 NOTE — Progress Notes (Signed)
[facility] MD Progress Note  07/11/2019 1:24 PM Joshua Mcdowell  MRN:  [id number] Subjective:  Patient is a 38 year old male with a past psychiatric history significant for polysubstance dependence who presented to the [facility] emergency department on 07/04/2019 after being found naked in his yard. He was brought in by police. The patient was noted to be responding to internal stimuli and quite disorganized.  Objective: Patient is seen and examined.  Patient is a 39 year old male with the above-stated past psychiatric history who is seen in follow-up.  He actually slept better last night.  He received Geodon 40 mg p.o. daily and 80 mg p.o. every afternoon.  This morning he still very strange, and complains of some dissociative thinking with regards to "who am I". He remains somewhat disorganized but clearer than he has been.  He remains paranoid and responding to internal stimuli but again decreased from previous.  He remains tachycardic, but blood pressure seems to be better today.  Earlier in the a.m. is 125/84.  As stated above, he slept 6.5 hours last night.  No new laboratories.  Principal Problem: Bizarre behavior Diagnosis: Principal Problem:   Bizarre behavior Active Problems:   Psychosis ([place])   Acute psychosis ([place])  Total Time spent with patient: 20 minutes  Past Psychiatric History: See admission H&P  Past Medical History:  Past Medical History:  Diagnosis Date  . Anxiety   . Depression     Past Surgical History:  Procedure Laterality Date  . ORIF ANKLE FRACTURE    . WISDOM TOOTH EXTRACTION     Family History: History reviewed. No pertinent family history. Family Psychiatric  History: See admission H&P Social History:  Social History   Substance and Sexual Activity  Alcohol Use Yes     Social History   Substance and Sexual Activity  Drug Use Yes  . Types: Cocaine, Heroin    Social History   Socioeconomic History  . Marital status: Single     Spouse name: Not on file  . Number of children: Not on file  . Years of education: Not on file  . Highest education level: Not on file  Occupational History  . Not on file  Tobacco Use  . Smoking status: Current Every Day Smoker    Types: Cigarettes  . Smokeless tobacco: Never Used  Substance and Sexual Activity  . Alcohol use: Yes  . Drug use: Yes    Types: Cocaine, Heroin  . Sexual activity: Not on file    Comment: UTA  Other Topics Concern  . Not on file  Social History Narrative  . Not on file   Social Determinants of Health   Financial Resource Strain:   . Difficulty of Paying Living Expenses:   Food Insecurity:   . Worried About Charity fundraiser in the Last Year:   . Arboriculturist in the Last Year:   Transportation Needs:   . Film/video editor (Medical):   Marland Kitchen Lack of Transportation (Non-Medical):   Physical Activity:   . Days of Exercise per Week:   . Minutes of Exercise per Session:   Stress:   . Feeling of Stress :   Social Connections:   . Frequency of Communication with Friends and Family:   . Frequency of Social Gatherings with Friends and Family:   . Attends Religious Services:   . Active Member of Clubs or Organizations:   . Attends Archivist Meetings:   Marland Kitchen Marital Status:  Additional Social History:                         Sleep: Fair  Appetite:  Fair  Current Medications: Current Facility-Administered Medications  Medication Dose Route Frequency Provider Last Rate Last Admin  . acetaminophen (TYLENOL) tablet 650 mg  650 mg Oral Q6H PRN Malvin Johns, MD   650 mg at 07/07/19 2050  . alum & mag hydroxide-simeth (MAALOX/MYLANTA) 200-200-20 MG/5ML suspension 30 mL  30 mL Oral Q4H PRN Malvin Johns, MD      . haloperidol (HALDOL) tablet 10 mg  10 mg Oral Q6H PRN Malvin Johns, MD   10 mg at 07/09/19 7591   Or  . haloperidol lactate (HALDOL) injection 10 mg  10 mg Intramuscular Q6H PRN Malvin Johns, MD   10 mg at  07/08/19 1001  . hydrOXYzine (ATARAX/VISTARIL) tablet 25 mg  25 mg Oral TID PRN Anike, Adaku C, NP   25 mg at 07/10/19 2108  . LORazepam (ATIVAN) tablet 1 mg  1 mg Oral TID Antonieta Pert, MD   1 mg at 07/11/19 0801  . magnesium hydroxide (MILK OF MAGNESIA) suspension 30 mL  30 mL Oral Daily PRN Malvin Johns, MD      . Oxcarbazepine (TRILEPTAL) tablet 600 mg  600 mg Oral BID Antonieta Pert, MD   600 mg at 07/11/19 0801  . traZODone (DESYREL) tablet 200 mg  200 mg Oral QHS PRN Antonieta Pert, MD      . ziprasidone (GEODON) capsule 40 mg  40 mg Oral Q breakfast Antonieta Pert, MD   40 mg at 07/11/19 0801  . ziprasidone (GEODON) capsule 40 mg  40 mg Oral NOW Jola Babinski Marlane Mingle, MD      . ziprasidone (GEODON) capsule 80 mg  80 mg Oral BID WC Antonieta Pert, MD      . ziprasidone (GEODON) injection 20 mg  20 mg Intramuscular Q6H PRN Antonieta Pert, MD   20 mg at 07/10/19 1348    Lab Results: No results found for this or any previous visit (from the past 48 hour(s)).  Blood Alcohol level:  Lab Results  Component Value Date   ETH <10 07/05/2019   ETH <10 07/04/2019    Metabolic Disorder Labs: No results found for: HGBA1C, MPG No results found for: PROLACTIN No results found for: CHOL, TRIG, HDL, CHOLHDL, VLDL, LDLCALC  Physical Findings: AIMS: Facial and Oral Movements Muscles of Facial Expression: None, normal Lips and Perioral Area: None, normal Jaw: None, normal Tongue: None, normal,Extremity Movements Upper (arms, wrists, hands, fingers): None, normal Lower (legs, knees, ankles, toes): None, normal, Trunk Movements Neck, shoulders, hips: None, normal, Overall Severity Severity of abnormal movements (highest score from questions above): None, normal Incapacitation due to abnormal movements: None, normal Patient's awareness of abnormal movements (rate only patient's report): No Awareness, Dental Status Current problems with teeth and/or dentures?: No Does  patient usually wear dentures?: No  CIWA:    COWS:     Musculoskeletal: Strength & Muscle Tone: within normal limits Gait & Station: normal Patient leans: N/A  Psychiatric Specialty Exam: Physical Exam  Nursing note and vitals reviewed. Constitutional: He is oriented to person, place, and time. He appears well-developed and well-nourished.  HENT:  Head: Normocephalic and atraumatic.  Respiratory: Effort normal.  Neurological: He is alert and oriented to person, place, and time.    Review of Systems  Blood pressure (!) 129/95, pulse Marland Kitchen)  121, temperature 98 F (36.7 C), temperature source Oral, resp. rate 18, height 6' (1.829 m), weight 69.9 kg, SpO2 98 %.Body mass index is 20.89 kg/m.  General Appearance: Disheveled  Eye Contact:  Good  Speech:  Normal Rate  Volume:  Increased  Mood:  Anxious, Dysphoric and Irritable  Affect:  Congruent  Thought Process:  Goal Directed and Descriptions of Associations: Loose  Orientation:  Full (Time, Place, and Person)  Thought Content:  Delusions, Hallucinations: Auditory, Paranoid Ideation and Rumination  Suicidal Thoughts:  No  Homicidal Thoughts:  No  Memory:  Immediate;   Poor Recent;   Poor Remote;   Poor  Judgement:  Impaired  Insight:  Lacking  Psychomotor Activity:  Increased  Concentration:  Concentration: Fair and Attention Span: Fair  Recall:  [employer] of Knowledge:  Fair  Language:  Good  Akathisia:  Negative  Handed:  Right  AIMS (if indicated):     Assets:  Desire for Improvement Resilience  ADL's:  Impaired  Cognition:  WNL  Sleep:  Number of Hours: 6.5     Treatment Plan Summary: Daily contact with patient to assess and evaluate symptoms and progress in treatment, Medication management and Plan : Patient is seen and examined.  Patient is a 39 year old male with the above-stated past psychiatric history who is seen in follow-up.   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  He is doing a little bit better today.  He finally slept last night.  He still having some mild dissociative things as well as psychosis, but not as prominent as it had been.  I will increase his Geodon to 80 mg p.o. twice daily.  We will continue the as needed's for agitation of Haldol and Geodon.  His Trileptal was increased to 600 mg p.o. twice daily yesterday, and I am not going to change that.  He will need a new EKG to assess QTc interval.  1 continue Haldol 10 mg IM or p.o. every 6 hours as needed agitation. 2.  Continue hydroxyzine 25 mg p.o. 3 times daily as needed anxiety. 3.  Continue lorazepam 1 mg p.o. 3 times daily for anxiety. 4.  Continue Trileptal 600 mg p.o. twice daily for anxiety and mood stability. 5.  Continue trazodone 200 mg p.o. nightly as needed insomnia. 6.  Increase Geodon to 80 mg p.o. twice daily for mood stability and psychosis. 7.  Continue Geodon 20 mg IM every 6 hours as needed agitation. 8.  Obtain EKG to assess rhythm and QTc interval. 9.  Disposition planning-in progress.  Antonieta Pert, MD 07/11/2019, 1:24 PM

## 2019-07-11 NOTE — Progress Notes (Signed)
   07/11/19 0100  Psych Admission Type (Psych Patients Only)  Admission Status Involuntary  Psychosocial Assessment  Patient Complaints Anxiety;Irritability  Eye Contact Brief  Facial Expression Anxious  Affect Appropriate to circumstance  Speech Tangential;Pressured  Interaction Demanding;Defensive  Motor Activity Fidgety;Restless;Pacing  Appearance/Hygiene Unremarkable  Behavior Characteristics Cooperative  Mood Anxious;Labile  Thought Process  Coherency Tangential;Disorganized;Circumstantial  Content Preoccupation;Paranoia  Delusions Paranoid  Perception Derealization  Hallucination None reported or observed  Judgment Limited  Confusion Mild  Danger to Self  Current suicidal ideation? Denies  Danger to Others  Danger to Others None reported or observed  Danger to Others Abnormal  Harmful Behavior to others No threats or harm toward other people  Destructive Behavior No threats or harm toward property

## 2019-07-11 NOTE — Progress Notes (Signed)
   07/11/19 2300  Psych Admission Type (Psych Patients Only)  Admission Status Involuntary  Psychosocial Assessment  Patient Complaints Anxiety  Eye Contact Brief  Facial Expression Anxious  Affect Appropriate to circumstance  Speech Tangential;Pressured  Interaction Demanding;Defensive  Motor Activity Fidgety;Restless;Pacing  Appearance/Hygiene Unremarkable  Behavior Characteristics Anxious  Mood Suspicious  Aggressive Behavior  Targets Other (Comment) (previous notes of aggression in [facility])  Thought Process  Coherency Tangential;Disorganized;Circumstantial  Content Preoccupation;Paranoia  Delusions Paranoid  Perception Derealization  Hallucination None reported or observed  Judgment Limited  Confusion Mild  Danger to Self  Current suicidal ideation? Denies  Danger to Others  Danger to Others None reported or observed  Danger to Others Abnormal  Harmful Behavior to others No threats or harm toward other people  Destructive Behavior No threats or harm toward property

## 2019-07-12 MED ORDER — BENZTROPINE MESYLATE 1 MG PO TABS
1.0000 mg | ORAL_TABLET | Freq: Two times a day (BID) | ORAL | Status: DC
  Administered 2019-07-12 – 2019-07-14 (×4): 1 mg via ORAL
  Filled 2019-07-12 (×4): qty 1
  Filled 2019-07-12: qty 14
  Filled 2019-07-12: qty 1
  Filled 2019-07-12 (×2): qty 14
  Filled 2019-07-12: qty 1
  Filled 2019-07-12: qty 14

## 2019-07-12 MED ORDER — HALOPERIDOL 5 MG PO TABS
ORAL_TABLET | ORAL | Status: AC
  Filled 2019-07-12: qty 2

## 2019-07-12 MED ORDER — TRAZODONE HCL 150 MG PO TABS
150.0000 mg | ORAL_TABLET | Freq: Every evening | ORAL | Status: DC | PRN
  Administered 2019-07-12 – 2019-07-13 (×2): 150 mg via ORAL
  Filled 2019-07-12: qty 1
  Filled 2019-07-12: qty 7
  Filled 2019-07-12: qty 1

## 2019-07-12 MED ORDER — DIVALPROEX SODIUM 250 MG PO DR TAB
250.0000 mg | DELAYED_RELEASE_TABLET | ORAL | Status: AC
  Administered 2019-07-12: 08:00:00 250 mg via ORAL

## 2019-07-12 MED ORDER — HALOPERIDOL LACTATE 5 MG/ML IJ SOLN
10.0000 mg | Freq: Two times a day (BID) | INTRAMUSCULAR | Status: DC
  Filled 2019-07-12 (×8): qty 2

## 2019-07-12 MED ORDER — DIVALPROEX SODIUM 250 MG PO DR TAB
DELAYED_RELEASE_TABLET | ORAL | Status: AC
  Filled 2019-07-12: qty 1

## 2019-07-12 MED ORDER — LORAZEPAM 1 MG PO TABS
1.0000 mg | ORAL_TABLET | Freq: Four times a day (QID) | ORAL | Status: DC | PRN
  Administered 2019-07-12 – 2019-07-14 (×4): 1 mg via ORAL
  Filled 2019-07-12 (×4): qty 1

## 2019-07-12 MED ORDER — HALOPERIDOL 5 MG PO TABS
10.0000 mg | ORAL_TABLET | Freq: Two times a day (BID) | ORAL | Status: DC
  Administered 2019-07-12 – 2019-07-14 (×5): 10 mg via ORAL
  Filled 2019-07-12: qty 28
  Filled 2019-07-12: qty 2
  Filled 2019-07-12: qty 28
  Filled 2019-07-12 (×2): qty 2
  Filled 2019-07-12: qty 28
  Filled 2019-07-12 (×2): qty 2
  Filled 2019-07-12: qty 28
  Filled 2019-07-12: qty 2

## 2019-07-12 NOTE — Progress Notes (Signed)
Pt continues to be disorganized . Pt believes he is a mutant and we are doing experiments on him . Pt informed that we do not do experiments here and we treat everyone the same

## 2019-07-12 NOTE — Progress Notes (Signed)
[facility] MD Progress Note  07/12/2019 10:48 AM Joshua Mcdowell  MRN:  [id number] Subjective:  Patient is a 39 year old male with a past psychiatric history significant for polysubstance dependence who presented to the [facility] emergency department on 07/04/2019 after being found naked in his yard. He was brought in by police. The patient was noted to be responding to internal stimuli and quite disorganized.  Objective: Patient is seen and examined.  Patient is a 40 year old male with the above-stated past psychiatric history is seen in follow-up.  Early this a.m. he is pacing up and down the hallway, being very intrusive.  He continues to be psychotic and believes that he is a mutant.  He still is having some dissociative things.  He remains paranoid.  His blood pressure this morning was stable, he still mildly tachycardic with a rate of approximately 1 17-1 20.  He is afebrile.  8.75 hours last night.  No new laboratories.  During the course of the hospitalization he has been treated with Seroquel, Trileptal, Geodon and really is only had minor improvement with that.  He has been compliant with the Geodon and Ativan.  Principal Problem: Bizarre behavior Diagnosis: Principal Problem:   Bizarre behavior Active Problems:   Psychosis (HCC)   Acute psychosis (HCC)  Total Time spent with patient: 20 minutes  Past Psychiatric History: See admission H&P  Past Medical History:  Past Medical History:  Diagnosis Date  . Anxiety   . Depression     Past Surgical History:  Procedure Laterality Date  . ORIF ANKLE FRACTURE    . WISDOM TOOTH EXTRACTION     Family History: History reviewed. No pertinent family history. Family Psychiatric  History: See admission H&P Social History:  Social History   Substance and Sexual Activity  Alcohol Use Yes     Social History   Substance and Sexual Activity  Drug Use Yes  . Types: Cocaine, Heroin    Social History    Socioeconomic History  . Marital status: Single    Spouse name: Not on file  . Number of children: Not on file  . Years of education: Not on file  . Highest education level: Not on file  Occupational History  . Not on file  Tobacco Use  . Smoking status: Current Every Day Smoker    Types: Cigarettes  . Smokeless tobacco: Never Used  Substance and Sexual Activity  . Alcohol use: Yes  . Drug use: Yes    Types: Cocaine, Heroin  . Sexual activity: Not on file    Comment: UTA  Other Topics Concern  . Not on file  Social History Narrative  . Not on file   Social Determinants of Health   Financial Resource Strain:   . Difficulty of Paying Living Expenses:   Food Insecurity:   . Worried About Programme researcher, broadcasting/film/video in the Last Year:   . Barista in the Last Year:   Transportation Needs:   . Freight forwarder (Medical):   Marland Kitchen Lack of Transportation (Non-Medical):   Physical Activity:   . Days of Exercise per Week:   . Minutes of Exercise per Session:   Stress:   . Feeling of Stress :   Social Connections:   . Frequency of Communication with Friends and Family:   . Frequency of Social Gatherings with Friends and Family:   . Attends Religious Services:   . Active Member of Clubs or Organizations:   . Attends  Club or Organization Meetings:   Marland Kitchen Marital Status:    Additional Social History:                         Sleep: Good  Appetite:  Good  Current Medications: Current Facility-Administered Medications  Medication Dose Route Frequency Provider Last Rate Last Admin  . acetaminophen (TYLENOL) tablet 650 mg  650 mg Oral Q6H PRN Johnn Hai, MD   650 mg at 07/07/19 2050  . alum & mag hydroxide-simeth (MAALOX/MYLANTA) 200-200-20 MG/5ML suspension 30 mL  30 mL Oral Q4H PRN Johnn Hai, MD      . divalproex (DEPAKOTE) 250 MG DR tablet           . haloperidol (HALDOL) 5 MG tablet           . haloperidol (HALDOL) tablet 10 mg  10 mg Oral Q6H PRN Johnn Hai, MD   10 mg at 07/09/19 0347   Or  . haloperidol lactate (HALDOL) injection 10 mg  10 mg Intramuscular Q6H PRN Johnn Hai, MD   10 mg at 07/08/19 1001  . haloperidol (HALDOL) tablet 10 mg  10 mg Oral BID Sharma Covert, MD   10 mg at 07/12/19 0820   Or  . haloperidol lactate (HALDOL) injection 10 mg  10 mg Intramuscular BID Sharma Covert, MD      . hydrOXYzine (ATARAX/VISTARIL) tablet 25 mg  25 mg Oral TID PRN Anike, Adaku C, NP   25 mg at 07/10/19 2108  . LORazepam (ATIVAN) tablet 1 mg  1 mg Oral Q6H PRN Sharma Covert, MD      . magnesium hydroxide (MILK OF MAGNESIA) suspension 30 mL  30 mL Oral Daily PRN Johnn Hai, MD      . traZODone (DESYREL) tablet 200 mg  200 mg Oral QHS PRN Sharma Covert, MD      . ziprasidone (GEODON) capsule 40 mg  40 mg Oral Q breakfast Sharma Covert, MD   40 mg at 07/12/19 0818  . ziprasidone (GEODON) capsule 80 mg  80 mg Oral BID WC Sharma Covert, MD   80 mg at 07/12/19 0815  . ziprasidone (GEODON) injection 20 mg  20 mg Intramuscular Q6H PRN Sharma Covert, MD   20 mg at 07/11/19 1412    Lab Results: No results found for this or any previous visit (from the past 48 hour(s)).  Blood Alcohol level:  Lab Results  Component Value Date   ETH <10 07/05/2019   ETH <10 42/59/5638    Metabolic Disorder Labs: No results found for: HGBA1C, MPG No results found for: PROLACTIN No results found for: CHOL, TRIG, HDL, CHOLHDL, VLDL, LDLCALC  Physical Findings: AIMS: Facial and Oral Movements Muscles of Facial Expression: None, normal Lips and Perioral Area: None, normal Jaw: None, normal Tongue: None, normal,Extremity Movements Upper (arms, wrists, hands, fingers): None, normal Lower (legs, knees, ankles, toes): None, normal, Trunk Movements Neck, shoulders, hips: None, normal, Overall Severity Severity of abnormal movements (highest score from questions above): None, normal Incapacitation due to abnormal movements:  None, normal Patient's awareness of abnormal movements (rate only patient's report): No Awareness, Dental Status Current problems with teeth and/or dentures?: No Does patient usually wear dentures?: No  CIWA:    COWS:     Musculoskeletal: Strength & Muscle Tone: within normal limits Gait & Station: normal Patient leans: N/A  Psychiatric Specialty Exam: Physical Exam  Nursing note and vitals reviewed.  Constitutional: He appears well-developed and well-nourished.  HENT:  Head: Normocephalic and atraumatic.  Respiratory: Effort normal.  Neurological: He is alert.    Review of Systems  Blood pressure (!) 123/58, pulse (!) 119, temperature (!) 97.3 F (36.3 C), temperature source Oral, resp. rate 18, height 6' (1.829 m), weight 69.9 kg, SpO2 98 %.Body mass index is 20.89 kg/m.  General Appearance: Disheveled  Eye Contact:  Good  Speech:  Pressured  Volume:  Increased  Mood:  Anxious, Dysphoric and Irritable  Affect:  Labile  Thought Process:  Goal Directed and Descriptions of Associations: Loose  Orientation:  Negative  Thought Content:  Delusions, Hallucinations: Auditory and Paranoid Ideation  Suicidal Thoughts:  No  Homicidal Thoughts:  No  Memory:  Immediate;   Poor Recent;   Poor Remote;   Poor  Judgement:  Impaired  Insight:  Lacking  Psychomotor Activity:  Increased  Concentration:  Concentration: Poor and Attention Span: Poor  Recall:  Poor  Fund of Knowledge:  Fair  Language:  Good  Akathisia:  Negative  Handed:  Right  AIMS (if indicated):     Assets:  Desire for Improvement Resilience  ADL's:  Impaired  Cognition:  WNL  Sleep:  Number of Hours: 8.75     Treatment Plan Summary: Daily contact with patient to assess and evaluate symptoms and progress in treatment, Medication management and Plan : Patient is seen and examined.  Patient is a 39 year old male with the above-stated past psychiatric history who is seen in follow-up.   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  He is back to being intrusive, aggressive, paranoid and threatening.  He will need some other augmentation to the Geodon to control his symptoms.  I am going to stop the Trileptal, and we will start a standing dose of Haldol on him as well as the Geodon.  We will start with 10 mg p.o. or IM twice daily.  We will see if this slows him down.  He was also given Depakote DR 250 mg p.o. now x1.  It seems to have helped slow him down and he is asleep currently.  I will put a standing dose of 500 mg p.o. nightly tonight.  I will decrease his trazodone down to 150 mg p.o. nightly as needed.  No change in the Geodon until we see how the Haldol reacts with him.  Given the Haldol I would also add Cogentin 1 mg p.o. twice daily for side effects of those medications.  No other changes in his medications at this point.  1.  Start Depakote DR 250 mg p.o. nightly for mood stability. 2.  Start haloperidol 10 mg IM or p.o. twice daily for psychosis and mood stability. 3.  Stop Trileptal. 4.  Continue hydroxyzine 25 mg p.o. 3 times daily as needed anxiety. 5.  Continue lorazepam 1 mg p.o. every 6 hours as needed anxiety or agitation. 6. Decrease trazodone to 150 mg p.o. nightly as needed insomnia. 7.  Continue Geodon 80 mg p.o. twice daily with food for mood stability as well as psychosis. 8.  Continue Geodon 20 mg IM every 6 hours as needed extreme agitation. 9.  Disposition planning-in progress. Antonieta Pert, MD 07/12/2019, 10:48 AM

## 2019-07-12 NOTE — Progress Notes (Signed)
The patient's positive event for the day is that he was able to get some rest. His goal for tomorrow is to get himself discharged.

## 2019-07-12 NOTE — Progress Notes (Signed)
Recreation Therapy Notes  Date: 4.28.21 Time: 1000 Location: 500 Hall Dayroom   Group Topic: Communication, Team Building, Problem Solving  Goal Area(s) Addresses:  Patient will effectively work with peer towards shared goal.  Patient will identify skills used to make activity successful.  Patient will identify how skills used during activity can be used to reach post d/c goals.   Intervention: STEM Activity  Activity: Landing Pad. In teams patients were given 12 plastic drinking straws and Mcdowell length of masking tape. Using the materials provided patients were asked to build Mcdowell landing pad to catch Mcdowell golf ball dropped from approximately 6 feet in the air.   Education:Social Skills, Discharge Planning   Education Outcome: Acknowledges education/In group clarification offered/Needs additional education.   Clinical Observations/Feedback:  Pt did not attend group.     Joshua Mcdowell, LRT/CTRS         Joshua Mcdowell 07/12/2019 11:55 AM 

## 2019-07-13 DIAGNOSIS — F250 Schizoaffective disorder, bipolar type: Secondary | ICD-10-CM

## 2019-07-13 DIAGNOSIS — F259 Schizoaffective disorder, unspecified: Secondary | ICD-10-CM | POA: Diagnosis present

## 2019-07-13 NOTE — [facility] Group Notes (Signed)
[facility] Mental Health Association Group Therapy 07/13/2019 2:52 PM  Type of Therapy: Mental Health Association Presentation  Participation Level: Active  Participation Quality: Attentive  Affect: Appropriate  Cognitive: Oriented  Insight: Developing/Improving  Engagement in Therapy: Engaged  Modes of Intervention: Discussion, Education and Socialization   Summary of Progress/Problems: Mental Health Association (MHA) Speaker came to talk about his personal journey with mental health. The pt processed ways by which to relate to the speaker. MHA speaker provided handouts and educational information pertaining to groups and services offered by the [facility]. Pt was engaged in speaker's presentation and was receptive to resources provided.   Enid Cutter, MSW, [facility] 07/13/2019 2:52 PM

## 2019-07-13 NOTE — Progress Notes (Signed)
Recreation Therapy Notes  Date: 4.29.21 Time: 1000 Location: 500 Hall Dayroom  Group Topic: Wellness  Goal Area(s) Addresses:  Patient will define components of whole wellness. Patient will verbalize benefit of whole wellness.  Behavioral Response: Minimal  Intervention: Music   Activity: Exercise.  LRT led group in a series of stretches to loosen up the body.  Each patient then led the group in an exercise of their choosing.  Patients were encouraged to take breaks and drink water as needed.  Education: Wellness, Building control surveyor.   Education Outcome: Acknowledges education/In group clarification offered/Needs additional education.   Clinical Observations/Feedback: Pt stated he was tired because he had been working out in his room.  Pt completed the stretches and some of the exercises.  Pt identified mental health as an aspect of wellness.  Pt also talked about better eating habits and going to bed earlier can help improve physical health.     Caroll Rancher, LRT/CTRS    Caroll Rancher A 07/13/2019 10:56 AM

## 2019-07-13 NOTE — Progress Notes (Signed)
DAR NOTE: Patient presents with anxious affect and mood.  Denies suicidal thoughts, pain, auditory and visual hallucinations.  Described energy level as normal and concentration as good.  Rates depression at 0, hopelessness at 0, and anxiety at 0.  Maintained on routine safety checks.  Medications given as prescribed.  Ativan 1 mg given for complain of anxiety with good effect.  Support and encouragement offered as needed.  Attended group and participated.  States goal for today is "becoming myself again."  Patient observed socializing with peers in the dayroom.  Patient is safe on and off the unit.

## 2019-07-13 NOTE — Progress Notes (Signed)
Pt less irritable this evening. Pt stated he could not sleep and decided to take Trazodone and Haldol per [facility]    07/13/19 0100  Psych Admission Type (Psych Patients Only)  Admission Status Involuntary  Psychosocial Assessment  Patient Complaints Anxiety  Eye Contact Brief  Facial Expression Anxious;Pensive;Wide-eyed  Affect Appropriate to circumstance  Speech Tangential;Pressured  Interaction Demanding;Defensive  Motor Activity Fidgety;Restless;Pacing  Appearance/Hygiene Unremarkable  Behavior Characteristics Anxious;Agitated  Mood Suspicious;Labile  Thought Process  Coherency Tangential;Disorganized;Circumstantial  Content Preoccupation;Paranoia  Delusions Paranoid  Perception Derealization  Hallucination None reported or observed  Judgment Limited  Confusion Mild  Danger to Self  Current suicidal ideation? Denies  Danger to Others  Danger to Others None reported or observed  Danger to Others Abnormal  Harmful Behavior to others No threats or harm toward other people  Destructive Behavior No threats or harm toward property

## 2019-07-13 NOTE — Progress Notes (Signed)
Psychoeducational Group Note  Date:  07/13/2019 Time:  2038  Group Topic/Focus:  Wrap-Up Group:   The focus of this group is to help patients review their daily goal of treatment and discuss progress on daily workbooks.  Participation Level: Did Not Attend  Participation Quality:  Not Applicable  Affect:  Not Applicable  Cognitive:  Not Applicable  Insight:  Not Applicable  Engagement in Group: Not Applicable  Additional Comments:  The patient did not attend group this evening.   Hazle Coca S 07/13/2019, 8:38 PM

## 2019-07-13 NOTE — Progress Notes (Signed)
[facility] [place] MD Progress Note  07/13/2019 2:11 PM Joshua Mcdowell  MRN:  [id number]   Subjective: Follow-up for this 39 year old male with new diagnosis of schizoaffective disorder.  Patient reports that he slept good last night and feels much better today.  He states that he is now realizing that a lot of what was going on was hallucinations.  He states that he has had some hallucinations in the past when he was not using drugs.  He states that actually he only remembers a couple times with having hallucinations when using drugs such as mushrooms.  He states that today it seems like everything was so real but is realizing that it was not.  He remembers getting naked and laying in his yard and stating that he was thinking he was arguing with the groups of people and they were all drinking the same red drink and it was a coincidence because his favorite colors red.  He also reports that he has had numerous friends tell him that he has made comments and said things that he does not remember saying and that he can have severe ups and downs.  He states that he can go days with no sleep or barely sleeping and still feeling energetic and hyperactive and then he will have severe lows to where he feels depressed and not feel like getting out of the house.  He reports that he has not used drugs since New Year's 2021 and that he has remained sober since then.  Principal Problem: Schizoaffective disorder (HCC) Diagnosis: Principal Problem:   Schizoaffective disorder (HCC) Active Problems:   Bizarre behavior   Psychosis (HCC)   Acute psychosis (HCC)  Total Time spent with patient: 20 minutes  Past Psychiatric History: Denies any psychiatric history. Does admit to chronic substance abuse prior to 2021  Past Medical History:  Past Medical History:  Diagnosis Date  . Anxiety   . Depression     Past Surgical History:  Procedure Laterality Date  . ORIF ANKLE FRACTURE    . WISDOM TOOTH EXTRACTION     Family  History: History reviewed. No pertinent family history. Family Psychiatric  History: Maternal great grandmother had an ill-defined but chronic lifelong psychotic disorder, mother was diagnosed with "severe personality disorder" according to his father and she committed suicide, writing a suicide note in 4 different personalities, sister died of a heroin overdose. Social History:  Social History   Substance and Sexual Activity  Alcohol Use Yes     Social History   Substance and Sexual Activity  Drug Use Yes  . Types: Cocaine, Heroin    Social History   Socioeconomic History  . Marital status: Single    Spouse name: Not on file  . Number of children: Not on file  . Years of education: Not on file  . Highest education level: Not on file  Occupational History  . Not on file  Tobacco Use  . Smoking status: Current Every Day Smoker    Types: Cigarettes  . Smokeless tobacco: Never Used  Substance and Sexual Activity  . Alcohol use: Yes  . Drug use: Yes    Types: Cocaine, Heroin  . Sexual activity: Not on file    Comment: UTA  Other Topics Concern  . Not on file  Social History Narrative  . Not on file   Social Determinants of Health   Financial Resource Strain:   . Difficulty of Paying Living Expenses:   Food Insecurity:   . Worried  About Running Out of Food in the Last Year:   . Ran Out of Food in the Last Year:   Transportation Needs:   . Lack of Transportation (Medical):   Marland Kitchen Lack of Transportation (Non-Medical):   Physical Activity:   . Days of Exercise per Week:   . Minutes of Exercise per Session:   Stress:   . Feeling of Stress :   Social Connections:   . Frequency of Communication with Friends and Family:   . Frequency of Social Gatherings with Friends and Family:   . Attends Religious Services:   . Active Member of Clubs or Organizations:   . Attends Banker Meetings:   Marland Kitchen Marital Status:    Additional Social History:                          Sleep: Fair  Appetite:  Good  Current Medications: Current Facility-Administered Medications  Medication Dose Route Frequency Provider Last Rate Last Admin  . acetaminophen (TYLENOL) tablet 650 mg  650 mg Oral Q6H PRN Malvin Johns, MD   650 mg at 07/13/19 1050  . alum & mag hydroxide-simeth (MAALOX/MYLANTA) 200-200-20 MG/5ML suspension 30 mL  30 mL Oral Q4H PRN Malvin Johns, MD      . benztropine (COGENTIN) tablet 1 mg  1 mg Oral BID Antonieta Pert, MD   1 mg at 07/13/19 0740  . haloperidol (HALDOL) tablet 10 mg  10 mg Oral Q6H PRN Malvin Johns, MD   10 mg at 07/13/19 4562   Or  . haloperidol lactate (HALDOL) injection 10 mg  10 mg Intramuscular Q6H PRN Malvin Johns, MD   10 mg at 07/08/19 1001  . haloperidol (HALDOL) tablet 10 mg  10 mg Oral BID Antonieta Pert, MD   10 mg at 07/13/19 0740   Or  . haloperidol lactate (HALDOL) injection 10 mg  10 mg Intramuscular BID Antonieta Pert, MD      . hydrOXYzine (ATARAX/VISTARIL) tablet 25 mg  25 mg Oral TID PRN Anike, Adaku C, NP   25 mg at 07/13/19 0917  . LORazepam (ATIVAN) tablet 1 mg  1 mg Oral Q6H PRN Antonieta Pert, MD   1 mg at 07/13/19 0917  . magnesium hydroxide (MILK OF MAGNESIA) suspension 30 mL  30 mL Oral Daily PRN Malvin Johns, MD      . traZODone (DESYREL) tablet 150 mg  150 mg Oral QHS PRN Antonieta Pert, MD   150 mg at 07/12/19 2139  . ziprasidone (GEODON) capsule 80 mg  80 mg Oral BID WC Antonieta Pert, MD   80 mg at 07/13/19 0740  . ziprasidone (GEODON) injection 20 mg  20 mg Intramuscular Q6H PRN Antonieta Pert, MD   20 mg at 07/11/19 1412    Lab Results: No results found for this or any previous visit (from the past 48 hour(s)).  Blood Alcohol level:  Lab Results  Component Value Date   ETH <10 07/05/2019   ETH <10 07/04/2019    Metabolic Disorder Labs: No results found for: HGBA1C, MPG No results found for: PROLACTIN No results found for: CHOL, TRIG, HDL, CHOLHDL, VLDL,  LDLCALC  Physical Findings: AIMS: Facial and Oral Movements Muscles of Facial Expression: None, normal Lips and Perioral Area: None, normal Jaw: None, normal Tongue: None, normal,Extremity Movements Upper (arms, wrists, hands, fingers): None, normal Lower (legs, knees, ankles, toes): None, normal, Trunk Movements Neck, shoulders, hips: None,  normal, Overall Severity Severity of abnormal movements (highest score from questions above): None, normal Incapacitation due to abnormal movements: None, normal Patient's awareness of abnormal movements (rate only patient's report): No Awareness, Dental Status Current problems with teeth and/or dentures?: No Does patient usually wear dentures?: No  CIWA:    COWS:     Musculoskeletal: Strength & Muscle Tone: within normal limits Gait & Station: normal Patient leans: N/A  Psychiatric Specialty Exam: Physical Exam  Review of Systems  Blood pressure 118/76, pulse (!) 132, temperature 98.7 F (37.1 C), temperature source Oral, resp. rate 18, height 6' (1.829 m), weight 69.9 kg, SpO2 98 %.Body mass index is 20.89 kg/m.  General Appearance: Disheveled  Eye Contact:  Good  Speech:  Clear and Coherent and Normal Rate  Volume:  Normal  Mood:  Anxious and Euthymic  Affect:  Congruent  Thought Process:  Coherent and Descriptions of Associations: Intact  Orientation:  Full (Time, Place, and Person)  Thought Content:  WDL  Suicidal Thoughts:  No  Homicidal Thoughts:  No  Memory:  Immediate;   Good Recent;   Good Remote;   Fair  Judgement:  Fair  Insight:  Fair  Psychomotor Activity:  Normal  Concentration:  Concentration: Fair  Recall:  [employer] of Knowledge:  Fair  Language:  Fair  Akathisia:  No  Handed:  Right  AIMS (if indicated):     Assets:  Communication Skills Desire for Improvement Housing Physical Health Social Support  ADL's:  Intact  Cognition:  WNL  Sleep:  Number of Hours: 6.75   Assessment: Patient presents much  more calm and cooperative today.  Patient does not appear to be hyperactive or intrusive.  He has been interacting with peers and staff appropriately and has a much more logical conversation today.  He has been compliant with his medications.  He does report a history of symptoms that indicate his schizoaffective disorder.  It was discussed for the possibility of starting the patient on Depakote however with the patient significant improvement with sleep and mood today we will continue current medications without changes.  Patient was noted refusing to agree to follow-up but today with his improved mood and stability he has agreed for follow-up at [facility].  Patient has denied any suicidal or homicidal ideations and denies any hallucinations.  Treatment Plan Summary: Daily contact with patient to assess and evaluate symptoms and progress in treatment and Medication management Continue Haldol 10 mg p.o. twice daily for psychotic symptoms Continue Vistaril 25 mg p.o. 3 times daily as needed for anxiety Continue trazodone 150 mg p.o. nightly for insomnia Continue Geodon 80 mg p.o. twice daily with meals for psychotic symptoms Continue Cogentin 1 mg p.o. twice daily for EPS Encourage group therapy participation Continue every 15 minute safety checks  Lewis Shock, FNP 07/13/2019, 2:11 PM

## 2019-07-14 MED ORDER — TRAZODONE HCL 150 MG PO TABS: 150.0000 mg | ORAL_TABLET | Freq: Every evening | ORAL | 1 refills | Status: AC | PRN

## 2019-07-14 MED ORDER — HALOPERIDOL 10 MG PO TABS: 10.0000 mg | ORAL_TABLET | Freq: Two times a day (BID) | ORAL | 1 refills | Status: AC

## 2019-07-14 MED ORDER — ZIPRASIDONE HCL 80 MG PO CAPS: 80.0000 mg | ORAL_CAPSULE | Freq: Two times a day (BID) | ORAL | 1 refills | Status: AC

## 2019-07-14 MED ORDER — BENZTROPINE MESYLATE 1 MG PO TABS: 1.0000 mg | ORAL_TABLET | Freq: Two times a day (BID) | ORAL | 1 refills | Status: AC

## 2019-07-14 NOTE — Progress Notes (Signed)
Recreation Therapy Notes  Date: 4.30.21 Time: 1000 Location: 500 Hall Dayroom  Group Topic: Communication, Team Building, Problem Solving  Goal Area(s) Addresses:  Patient will effectively work with peer towards shared goal.  Patient will identify skill used to make activity successful.  Patient will identify how skills used during activity can be used to reach post d/c goals.   Behavioral Response: Minimal  Intervention: STEM Activity   Activity: In team's, using 10 red plastic cups, patients were asked to flip the cups over and build a pyramid using the rubber band contraption provided to them.    Education: Pharmacist, community, Building control surveyor.   Education Outcome: Acknowledges education/In group clarification offered/Needs additional education.   Clinical Observations/Feedback: Pt gave minimal effort during group.  Pt would try to cheat by turning the cups over and stacking them with his hands.  Pt eventually gave up and observed the remainder of group.      Caroll Rancher, LRT/CTRS    Caroll Rancher A 07/14/2019 12:05 PM

## 2019-07-14 NOTE — Progress Notes (Signed)
Pt discharged to lobby. Pt was stable and appreciative at that time. All papers, samples and prescriptions were given and valuables returned. Verbal understanding expressed. Denies SI/HI and A/VH. Pt given opportunity to express concerns and ask questions.  

## 2019-07-14 NOTE — [facility] Suicide Risk Assessment (Signed)
[facility] Discharge Suicide Risk Assessment   Principal Problem: Schizoaffective disorder [facility]) Discharge Diagnoses: Principal Problem:   Schizoaffective disorder (HCC) Active Problems:   Bizarre behavior   Psychosis (HCC)   Acute psychosis (HCC) Patient is a 39 year old male with chronic polysubstance use, psychosis who was hospitalized due to being found lying nude in his yard, having various pills in his room, following spoons as if he had been using heroin and had a suspected overdose on Ambien.  Patient reports that he is doing better today, as that he does not have psychotic symptoms, is eating fine, sleeping well. Patient reports he feels a little tired in the mornings, adds that he does not like taking psychotropic medication but is okay with staying on it as he knows it helps.  In regards to substance use, patient reports that he will follow-up outpatient .  Patient denies any other side effects with the medications, any thoughts of self-harm, harm to others, any symptoms of depression, mania or psychosis  Total Time spent with patient: 20 minutes  Musculoskeletal: Strength & Muscle Tone: within normal limits Gait & Station: normal Patient leans: N/A  Psychiatric Specialty Exam: Review of Systems  Blood pressure 123/62, pulse (!) 114, temperature 98.7 F (37.1 C), temperature source Oral, resp. rate 18, height 6' (1.829 m), weight 69.9 kg, SpO2 98 %.Body mass index is 20.89 kg/m.  General Appearance: Casual  Eye Contact::  Fair  Speech:  Clear and Coherent and Normal Rate409  Volume:  Normal  Mood:  Euthymic  Affect:  Blunt  Thought Process:  Coherent, Goal Directed and Descriptions of Associations: Intact  Orientation:  Full (Time, Place, and Person)  Thought Content:  WDL  Suicidal Thoughts:  No  Homicidal Thoughts:  No  Memory:  Immediate;   Fair Recent;   Fair Remote;   Fair  Judgement:  Intact  Insight:  Present  Psychomotor Activity:  Normal  Concentration:  Fair   Recall:  [employer] of Knowledge:Fair  Language: Fair  Akathisia:  No  Handed:  Right  AIMS (if indicated):     Assets:  Housing Leisure Time Social Support  Sleep:  Number of Hours: 6.75  Cognition: WNL  ADL's:  Intact   Mental Status Per Nursing Assessment::   On Admission:  NA  Demographic Factors:  Male  Loss Factors: NA  Historical Factors: Impulsivity  Risk Reduction Factors:   Positive social support  Continued Clinical Symptoms:  Alcohol/Substance Abuse/Dependencies Previous Psychiatric Diagnoses and Treatments  Cognitive Features That Contribute To Risk:  None    Suicide Risk:  Minimal: No identifiable suicidal ideation.  Patients presenting with no risk factors but with morbid ruminations; may be classified as minimal risk based on the severity of the depressive symptoms  Follow-up Information    [facility] Follow up on 07/18/2019.   Why: You are scheduled for an appointment on 07/18/19 at 10:30 am.  This will be a virtual tele-health appointment.   Contact information: [address] [place] [address] [phone]           Plan Of Care/Follow-up recommendations:  Activity:  As tolerated Diet:  Heart healthy diet Other:  Take medications as prescribed and keep follow-up appointments  Nelly Rout, MD 07/14/2019, 12:07 PM

## 2019-07-14 NOTE — Plan of Care (Signed)
Pt attended groups and engaged in a calm and appropriate mood at completion of recreation therapy group sessions.   Caroll Rancher, LRT/CTRS

## 2019-07-14 NOTE — Progress Notes (Signed)
  [facility] Adult Case Management Discharge Plan :  Will you be returning to the same living situation after discharge:  Yes,  own home At discharge, do you have transportation home?: Yes,  girlfriend Do you have the ability to pay for your medications: No. Pt will work with [employer].  Release of information consent forms completed and in the chart;  Patient's signature needed at discharge.  Patient to Follow up at: Follow-up Information    [facility] Follow up on 07/18/2019.   Why: You are scheduled for an appointment on 07/18/19 at 10:30 am.  This will be a virtual tele-health appointment.   Contact information: [address] [place] [address] [phone]           Next level of care provider has access to [facility] Link:no  Safety Planning and Suicide Prevention discussed: No. Pt declined consent.      Has patient been referred to the Quitline?: N/A patient is not a smoker  Patient has been referred for addiction treatment: N/A  Lorri Frederick, LCSW 07/14/2019, 9:32 AM

## 2019-07-14 NOTE — Progress Notes (Addendum)
Pt was awakened by another pt making noise and flushing the toilet numerous times in the morning , so pt was given PRN Ativan 1 mg

## 2019-07-14 NOTE — Progress Notes (Signed)
Recreation Therapy Notes  INPATIENT RECREATION TR PLAN  Patient Details Name: Joshua Mcdowell MRN: [id number] DOB: 1981-02-22 Today's Date: 07/14/2019  Rec Therapy Plan Is patient appropriate for Therapeutic Recreation?: Yes Treatment times per week: about 3 days Estimated Length of Stay: 5-7 days TR Treatment/Interventions: Group participation (Comment)  Discharge Criteria Pt will be discharged from therapy if:: Discharged Treatment plan/goals/alternatives discussed and agreed upon by:: Patient/family  Discharge Summary Short term goals set: See patient care plan Short term goals met: Complete Progress toward goals comments: Groups attended Which groups?: Leisure education, Wellness, Coping skills, Other (Comment)(Team building) Reason goals not met: N/A Therapeutic equipment acquired: None Reason patient discharged from therapy: Discharge from hospital Pt/family agrees with progress & goals achieved: Yes Date patient discharged from therapy: 07/14/19    Victorino Sparrow, LRT/CTRS  Ria Comment, Tamaria Dunleavy A 07/14/2019, 12:18 PM

## 2019-07-14 NOTE — Progress Notes (Signed)
   07/14/19 0100  Psych Admission Type (Psych Patients Only)  Admission Status Involuntary  Psychosocial Assessment  Patient Complaints Anxiety  Eye Contact Brief  Facial Expression Anxious;Pensive;Wide-eyed  Affect Appropriate to circumstance  Speech Tangential;Pressured  Interaction Demanding;Defensive  Motor Activity Fidgety;Restless;Pacing  Appearance/Hygiene Unremarkable  Behavior Characteristics Anxious  Mood Labile  Thought Process  Coherency Tangential;Disorganized;Circumstantial  Content Preoccupation;Paranoia  Delusions Paranoid  Perception Derealization  Hallucination None reported or observed  Judgment Limited  Confusion Mild  Danger to Self  Current suicidal ideation? Denies  Danger to Others  Danger to Others None reported or observed  Danger to Others Abnormal  Harmful Behavior to others No threats or harm toward other people  Destructive Behavior No threats or harm toward property

## 2019-07-14 NOTE — Discharge Summary (Signed)
Physician Discharge Summary Note  Patient:  Joshua Mcdowell is an 39 y.o., male MRN:  [id number] DOB:  12/08/1980 Patient phone:  [phone] (home)  Patient address:   [address] [place] [address],  Total Time spent with patient: 30 minutes  Date of Admission:  07/06/2019 Date of Discharge: 07/14/19  Reason for Admission:  39 year old patient with chronic polysubstance abuse, presenting twice within 48 hours to the emergency department.  On his presentation of 4/20 he was described as "found lying nude in his yard" EMS brought him in, and further found various pills in his room, foil and spoons as if he had been abusing heroin, and further, they suspected he had overdosed on Ambien.  Last drug screen was negative at any rate the patient cleared in a reasonable timeframe with IM medications, and was discharged on 4/21 only to re-present 5 hours later with again confusional psychosis.  Principal Problem: Schizoaffective disorder [facility]) Discharge Diagnoses: Principal Problem:   Schizoaffective disorder ([place]) Active Problems:   Bizarre behavior   Psychosis ([place])   Acute psychosis ([place])   Past Psychiatric History: None reported  Past Medical History:  Past Medical History:  Diagnosis Date  . Anxiety   . Depression     Past Surgical History:  Procedure Laterality Date  . ORIF ANKLE FRACTURE    . WISDOM TOOTH EXTRACTION     Family History: History reviewed. No pertinent family history. Family Psychiatric  History: Maternal great grandmother had an ill-defined but chronic lifelong psychotic disorder, mother was diagnosed with "severe personality disorder" according to his father and she committed suicide, writing a suicide note in 45 different personalities, sister died of a heroin overdose. Social History:  Social History   Substance and Sexual Activity  Alcohol Use Yes     Social History   Substance and Sexual Activity  Drug Use Yes  . Types: Cocaine,  Heroin    Social History   Socioeconomic History  . Marital status: Single    Spouse name: Not on file  . Number of children: Not on file  . Years of education: Not on file  . Highest education level: Not on file  Occupational History  . Not on file  Tobacco Use  . Smoking status: Current Every Day Smoker    Types: Cigarettes  . Smokeless tobacco: Never Used  Substance and Sexual Activity  . Alcohol use: Yes  . Drug use: Yes    Types: Cocaine, Heroin  . Sexual activity: Not on file    Comment: UTA  Other Topics Concern  . Not on file  Social History Narrative  . Not on file   Social Determinants of Health   Financial Resource Strain:   . Difficulty of Paying Living Expenses:   Food Insecurity:   . Worried About Charity fundraiser in the Last Year:   . Arboriculturist in the Last Year:   Transportation Needs:   . Film/video editor (Medical):   Marland Kitchen Lack of Transportation (Non-Medical):   Physical Activity:   . Days of Exercise per Week:   . Minutes of Exercise per Session:   Stress:   . Feeling of Stress :   Social Connections:   . Frequency of Communication with Friends and Family:   . Frequency of Social Gatherings with Friends and Family:   . Attends Religious Services:   . Active Member of Clubs or Organizations:   . Attends Archivist Meetings:   .  Marital Status:     Hospital Course:  Patient remained on the [facility] unit for 8 days. The patient stabilized on medication and therapy. Patient was discharged on Cogentin 1 mg twice daily, Haldol 10 mg twice daily, Vistaril 25 mg 3 times daily as needed, trazodone 150 mg nightly as needed, Geodon 80 mg twice daily with meals. Patient has shown improvement with improved mood, affect, sleep, appetite, and interaction. Patient has attended group and participated. Patient has been seen in the day room interacting with peers and staff appropriately. Patient denies any SI/HI/AVH and contracts for safety. Patient  agrees to follow up at [facility]. Patient is provided with prescriptions for their medications upon discharge.  Physical Findings: AIMS: Facial and Oral Movements Muscles of Facial Expression: None, normal Lips and Perioral Area: None, normal Jaw: None, normal Tongue: None, normal,Extremity Movements Upper (arms, wrists, hands, fingers): None, normal Lower (legs, knees, ankles, toes): None, normal, Trunk Movements Neck, shoulders, hips: None, normal, Overall Severity Severity of abnormal movements (highest score from questions above): None, normal Incapacitation due to abnormal movements: None, normal Patient's awareness of abnormal movements (rate only patient's report): No Awareness, Dental Status Current problems with teeth and/or dentures?: No Does patient usually wear dentures?: No  CIWA:    COWS:     Musculoskeletal: Strength & Muscle Tone: within normal limits Gait & Station: normal Patient leans: N/A  Psychiatric Specialty Exam: Physical Exam  Nursing note and vitals reviewed. Constitutional: He is oriented to person, place, and time. He appears well-developed and well-nourished.  Cardiovascular: Normal rate.  Respiratory: Effort normal.  Musculoskeletal:        General: Normal range of motion.  Neurological: He is alert and oriented to person, place, and time.  Skin: Skin is warm.    Review of Systems  Constitutional: Negative.   HENT: Negative.   Eyes: Negative.   Respiratory: Negative.   Cardiovascular: Negative.   Gastrointestinal: Negative.   Genitourinary: Negative.   Musculoskeletal: Negative.   Skin: Negative.   Neurological: Negative.   Psychiatric/Behavioral: Negative.     Blood pressure 123/62, pulse (!) 114, temperature 98.7 F (37.1 C), temperature source Oral, resp. rate 18, height 6' (1.829 m), weight 69.9 kg, SpO2 98 %.Body mass index is 20.89 kg/m.   General Appearance: Casual  Eye Contact::  Fair  Speech:  Clear and Coherent and Normal  Rate409  Volume:  Normal  Mood:  Euthymic  Affect:  Blunt  Thought Process:  Coherent, Goal Directed and Descriptions of Associations: Intact  Orientation:  Full (Time, Place, and Person)  Thought Content:  WDL  Suicidal Thoughts:  No  Homicidal Thoughts:  No  Memory:  Immediate;   Fair Recent;   Fair Remote;   Fair  Judgement:  Intact  Insight:  Present  Psychomotor Activity:  Normal  Concentration:  Fair  Recall:  [employer] of Knowledge:Fair  Language: Fair  Akathisia:  No  Handed:  Right  AIMS (if indicated):     Assets:  Housing Leisure Time Social Support  Sleep:  Number of Hours: 6.75  Cognition: WNL  ADL's:  Intact      Has this patient used any form of tobacco in the last 30 days? (Cigarettes, Smokeless Tobacco, Cigars, and/or Pipes) Yes, Yes, A prescription for an FDA-approved tobacco cessation medication was offered at discharge and the patient refused  Blood Alcohol level:  Lab Results  Component Value Date   ETH <10 07/05/2019   ETH <10 07/04/2019  Metabolic Disorder Labs:  No results found for: HGBA1C, MPG No results found for: PROLACTIN No results found for: CHOL, TRIG, HDL, CHOLHDL, VLDL, LDLCALC  See Psychiatric Specialty Exam and Suicide Risk Assessment completed by Attending Physician prior to discharge.  Discharge destination:  Home  Is patient on multiple antipsychotic therapies at discharge:  No   Has Patient had three or more failed trials of antipsychotic monotherapy by history:  No  Recommended Plan for Multiple Antipsychotic Therapies: NA   Allergies as of 07/14/2019   No Known Allergies     Medication List    TAKE these medications     Indication  benztropine 1 MG tablet Commonly known as: COGENTIN Take 1 tablet (1 mg total) by mouth 2 (two) times daily.  Indication: Extrapyramidal Reaction caused by Medications   haloperidol 10 MG tablet Commonly known as: HALDOL Take 1 tablet (10 mg total) by mouth 2 (two) times  daily.  Indication: Schizoaffective   traZODone 150 MG tablet Commonly known as: DESYREL Take 1 tablet (150 mg total) by mouth at bedtime as needed for sleep.  Indication: Trouble Sleeping   ziprasidone 80 MG capsule Commonly known as: GEODON Take 1 capsule (80 mg total) by mouth 2 (two) times daily with a meal.  Indication: Schizoaffective      Follow-up Information    [facility] Follow up on 07/18/2019.   Why: You are scheduled for an appointment on 07/18/19 at 10:30 am.  This will be a virtual tele-health appointment.   Contact information: [address] [place] [address] [phone]           Follow-up recommendations:  Continue activity as tolerated. Continue diet as recommended by your PCP. Ensure to keep all appointments with outpatient providers.  Comments:  Patient is instructed prior to discharge to: Take all medications as prescribed by his/her mental healthcare provider. Report any adverse effects and or reactions from the medicines to his/her outpatient provider promptly. Patient has been instructed & cautioned: To not engage in alcohol and or illegal drug use while on prescription medicines. In the event of worsening symptoms, patient is instructed to call the crisis hotline, 911 and or go to the nearest ED for appropriate evaluation and treatment of symptoms. To follow-up with his/her primary care provider for your other medical issues, concerns and or health care needs.    Signed: Gerlene Burdock Cherise Fedder, FNP 07/14/2019, 9:31 AM

## 2019-07-15 ENCOUNTER — Ambulatory Visit (HOSPITAL_COMMUNITY)
Admission: AD | Admit: 2019-07-15 | Discharge: 2019-07-15 | Disposition: A | Discharge status: Home / Self Care | Payer: Federal, State, Local not specified - Other | Attending: Psychiatry | Admitting: Psychiatry

## 2019-07-15 DIAGNOSIS — R45851 Suicidal ideations: Secondary | ICD-10-CM | POA: Diagnosis not present

## 2019-07-15 NOTE — BH Assessment (Signed)
Assessment Note  Joshua Mcdowell is an 39 y.o. male who presented to [facility] as a walk-in.  He was just discharged from the inpatient unit yesterday. Patient presenting to [facility] today stating that his medications are too strong and they are making him feel fatigued and tired all the time.  He states that he would like to get them changed.  He denies any current SI/HI/Psychosis currently, but states that since he was discharged from the unit that he has been frustrated and feels like he would rather die than to feel this way, but patient has no plan or intent.  He states that he has never tried to hurt himself or anyone else in the past.  Patient states that he would like to get into a drug rehab center.  He has an appointment scheduled with [facility] on 07/18/2019.  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  Patient states that he has not been sleeping well despite taking his medications.  He states that his appetite is good.  Patient denies any history of abuse or self-mutilation.  He states that he is currently homeless and has minimal support.  Patient presents as alert and oriented.  He is pleasant and cooperative. His judgment, insight and impulse control appear to be good at this time.  His thoughts are organized and his memory intact.  He does not appear to be responding to any internal stimuli.  Diagnosis: Schizoaffective Disorder F25  Past Medical History:  Past Medical History:  Diagnosis Date  . Anxiety   . Depression     Past Surgical History:  Procedure Laterality Date  . ORIF ANKLE FRACTURE    . WISDOM TOOTH EXTRACTION      Family History: No family history on file.  Social History:  reports that he has been smoking cigarettes. He has never used smokeless tobacco. He reports current alcohol use. He reports current drug use. Drugs: Cocaine and Heroin.  Additional Social History:  Alcohol / Drug Use Pain  Medications: None Prescriptions: None Over the Counter: None History of alcohol / drug use?: Yes Longest period of sobriety (when/how long): 1.5 years Negative Consequences of Use: Financial, Personal relationships Substance #1 Name of Substance 1: opioids 1 - Age of First Use: UTA 1 - Amount (size/oz): UTA 1 - Frequency: daily 1 - Duration: UTA 1 - Last Use / Amount: clean and sober for the past month  CIWA: CIWA-Ar BP: 121/77 Pulse Rate: (!) 136(Anthony A. RN was notified) COWS:    Allergies: No Known Allergies  Home Medications: (Not in a hospital admission)   OB/GYN Status:  No LMP for male patient.  General Assessment Data Location of Assessment: [facility] Assessment Services TTS Assessment: In system Is this a Tele or Face-to-Face Assessment?: Face-to-Face Is this an Initial Assessment or a Re-assessment for this encounter?: Initial Assessment Patient Accompanied by:: N/A Language Other than English: No Living Arrangements: Homeless/Shelter What gender do you identify as?: Male Marital status: Single Living Arrangements: Alone Can pt return to current living arrangement?: Yes Admission Status: Voluntary Is patient capable of signing voluntary admission?: Yes Referral Source: Self/Family/Friend Insurance type: self-pay  Medical Screening Exam ([place]) Medical Exam completed: Yes  Crisis Care Plan Living Arrangements: Alone Legal Guardian: Other:(self) Name of Psychiatrist: Beverly Sessions Name of Therapist: Beverly Sessions  Education Status Is patient currently in school?: No Is the patient employed, unemployed or receiving disability?:  Unemployed  Risk to self with the past 6 months Suicidal Ideation: No Has patient been a risk to self within the past 6 months prior to admission? : No Suicidal Intent: No Has patient had any suicidal intent within the past 6 months prior to admission? : No Is patient at risk for suicide?: No Suicidal Plan?: No Has patient had  any suicidal plan within the past 6 months prior to admission? : No Access to Means: No What has been your use of drugs/alcohol within the last 12 months?: none Previous Attempts/Gestures: No How many times?: 0 Other Self Harm Risks: (homeless and minimal support) Triggers for Past Attempts: None known Intentional Self Injurious Behavior: None Family Suicide History: Yes Recent stressful life event(s): Job Loss, Financial Problems, Other (Comment)(homeless and minimal support) Persecutory voices/beliefs?: No Depression: Yes Depression Symptoms: Despondent, Insomnia, Isolating, Loss of interest in usual pleasures, Feeling worthless/self pity Substance abuse history and/or treatment for substance abuse?: No Suicide prevention information given to non-admitted patients: Not applicable  Risk to Others within the past 6 months Homicidal Ideation: No Does patient have any lifetime risk of violence toward others beyond the six months prior to admission? : No Thoughts of Harm to Others: No Current Homicidal Intent: No Current Homicidal Plan: No Access to Homicidal Means: No Identified Victim: none History of harm to others?: No Assessment of Violence: None Noted Violent Behavior Description: none Does patient have access to weapons?: No Criminal Charges Pending?: No Does patient have a court date: No Is patient on probation?: No  Psychosis Hallucinations: None noted Delusions: None noted  Mental Status Report Appearance/Hygiene: Unremarkable Eye Contact: Good Motor Activity: Freedom of movement Speech: Logical/coherent Level of Consciousness: Alert Mood: Pleasant Affect: Appropriate to circumstance Anxiety Level: Moderate Thought Processes: Coherent, Relevant Judgement: Unimpaired Orientation: Person, Place, Time, Situation Obsessive Compulsive Thoughts/Behaviors: None  Cognitive Functioning Concentration: Good Memory: Recent Intact, Remote Intact Is patient IDD:  No Insight: Good Impulse Control: Good Appetite: Good Have you had any weight changes? : No Change Sleep: Decreased Total Hours of Sleep: 2 Vegetative Symptoms: None  ADLScreening [facility] Assessment Services) Patient's cognitive ability adequate to safely complete daily activities?: Yes Patient able to express need for assistance with ADLs?: Yes Independently performs ADLs?: Yes (appropriate for developmental age)  Prior Inpatient Therapy Prior Inpatient Therapy: Yes Prior Therapy Dates: 2 weeks ago Prior Therapy Facilty/Provider(s): [facility] Reason for Treatment: schizoaffective Disorder  Prior Outpatient Therapy Prior Outpatient Therapy: Yes Prior Therapy Dates: appt next week Prior Therapy Facilty/Provider(s): [facility] Reason for Treatment: schizo affective Does patient have an ACCT team?: No Does patient have Intensive In-House Services?  : No Does patient have [facility] services? : No Does patient have [facility] services?: No  ADL Screening (condition at time of admission) Patient's cognitive ability adequate to safely complete daily activities?: Yes Is the patient deaf or have difficulty hearing?: No Does the patient have difficulty seeing, even when wearing glasses/contacts?: No Does the patient have difficulty concentrating, remembering, or making decisions?: No Patient able to express need for assistance with ADLs?: Yes Does the patient have difficulty dressing or bathing?: No Independently performs ADLs?: Yes (appropriate for developmental age) Does the patient have difficulty walking or climbing stairs?: No Weakness of Legs: None Weakness of Arms/Hands: None  Home Assistive Devices/Equipment Home Assistive Devices/Equipment: None  Therapy Consults (therapy consults require a physician order) PT Evaluation Needed: No OT Evalulation Needed: No SLP Evaluation Needed: No Abuse/Neglect Assessment (Assessment to be complete while patient is alone) Abuse/Neglect Assessment  Can  Be Completed: Yes Physical Abuse: Denies Verbal Abuse: Denies Sexual Abuse: Denies Exploitation of patient/patient's resources: Denies Self-Neglect: Denies Values / Beliefs Cultural Requests During Hospitalization: None Spiritual Requests During Hospitalization: None Consults Spiritual Care Consult Needed: No Transition of Care Team Consult Needed: No Advance Directives (For Healthcare) Does Patient Have a Medical Advance Directive?: No Would patient like information on creating a medical advance directive?: No - Patient declined Nutrition Screen- [facility] Adult/WL/AP Has the patient recently lost weight without trying?: No Has the patient been eating poorly because of a decreased appetite?: Yes Malnutrition Screening Tool Score: 1        Disposition:  Per Hillery Jacks, NP patient does not meet inpatient admission criteria.  He is scheduled to follow-up with [facility] on 07/18/2019 Disposition Initial Assessment Completed for this Encounter: Yes Disposition of Patient: Discharge Patient refused recommended treatment: No Mode of transportation if patient is discharged/movement?: Car Patient referred to: Outpatient clinic referral([facility])  On Site Evaluation by:   Reviewed with Physician:    Arnoldo Lenis Jamelle Noy 07/15/2019 3:24 PM

## 2019-07-15 NOTE — H&P (Signed)
Behavioral Health Medical Screening Exam  Joshua Mcdowell is an 39 y.o. male. Presented as a walk-in to [facility]. Reported " these medication are too sedating and are making me more suicidal."  Patient yesterday with a follow-up appointment on Tuesday to [facility].  [name] reported  " I do not have any place to go and would like to get into [facility]."  Stated he is contacted [facility] who reports no available beds until Monday.  Patient is requesting to stay until beds become available.  Denies auditory or visual hallucinations.  Denies homicidal ideations.  Denies plan or intent.  Patient was provided with additional outpatient resources for housing.  Support, encouragement and reassurance was provided.  Total Time spent with patient: 15 minutes  Psychiatric Specialty Exam: Physical Exam  Vitals reviewed. Skin: Skin is warm and dry.    Review of Systems  Blood pressure 121/77, pulse (!) 136, temperature 98.6 F (37 C), temperature source Oral, resp. rate 16, SpO2 98 %.There is no height or weight on file to calculate BMI.  General Appearance: Casual  Eye Contact:  Fair  Speech:  Clear and Coherent  Volume:  Normal  Mood:  Depressed  Affect:  Congruent  Thought Process:  Coherent  Orientation:  Full (Time, Place, and Person)  Thought Content:  WDL  Suicidal Thoughts:  Yes.  without intent/plan  Homicidal Thoughts:  No  Memory:  Immediate;   Fair Recent;   Fair  Judgement:  Fair  Insight:  Fair  Psychomotor Activity:  Normal  Concentration: Concentration: Fair  Recall:  [employer] of Knowledge:Fair  Language: Fair  Akathisia:  No  Handed:  Right  AIMS (if indicated):     Assets:  Communication Skills Desire for Improvement Resilience Social Support  Sleep:       Musculoskeletal: Strength & Muscle Tone: within normal limits Gait & Station: normal Patient leans: N/A  Blood pressure 121/77, pulse (!) 136, temperature 98.6 F (37 C), temperature source Oral, resp. rate 16,  SpO2 98 %.  Recommendations: Keep follow-up appointments with outpatient provider Additional resources was provided for housing  Based on my evaluation the patient does not appear to have an emergency medical condition.  Oneta Rack, NP 07/15/2019, 2:22 PM

## 2019-07-19 MED ORDER — DIPHENHYDRAMINE HCL 50 MG/ML IJ SOLN
50.00 | INTRAMUSCULAR | Status: DC
Start: ? — End: 2019-07-19

## 2019-07-19 MED ORDER — QUINTABS PO TABS
1.00 | ORAL_TABLET | ORAL | Status: DC
Start: 2019-07-20 — End: 2019-07-19

## 2019-07-19 MED ORDER — QUETIAPINE FUMARATE 50 MG PO TABS
50.00 | ORAL_TABLET | ORAL | Status: DC
Start: 2019-07-19 — End: 2019-07-19

## 2019-07-19 MED ORDER — SALINE NASAL SPRAY 0.65 % NA SOLN
1.00 | NASAL | Status: DC
Start: ? — End: 2019-07-19

## 2019-07-19 MED ORDER — LOPERAMIDE HCL 2 MG PO CAPS
2.00 | ORAL_CAPSULE | ORAL | Status: DC
Start: ? — End: 2019-07-19

## 2019-07-19 MED ORDER — LORAZEPAM 2 MG/ML IJ SOLN
2.00 | INTRAMUSCULAR | Status: DC
Start: ? — End: 2019-07-19

## 2019-07-19 MED ORDER — BENZOCAINE-MENTHOL 6-10 MG MT LOZG
1.00 | LOZENGE | OROMUCOSAL | Status: DC
Start: ? — End: 2019-07-19

## 2019-07-19 MED ORDER — OLANZAPINE 10 MG PO TABS
10.00 | ORAL_TABLET | ORAL | Status: DC
Start: ? — End: 2019-07-19

## 2019-07-19 MED ORDER — TRAZODONE HCL 50 MG PO TABS
50.00 | ORAL_TABLET | ORAL | Status: DC
Start: ? — End: 2019-07-19

## 2019-07-19 MED ORDER — GUAIFENESIN 100 MG/5ML PO SYRP
200.00 | ORAL_SOLUTION | ORAL | Status: DC
Start: ? — End: 2019-07-19

## 2019-07-19 MED ORDER — ACETAMINOPHEN 325 MG PO TABS
650.00 | ORAL_TABLET | ORAL | Status: DC
Start: ? — End: 2019-07-19

## 2019-07-19 MED ORDER — HYDROXYZINE HCL 25 MG PO TABS
50.00 | ORAL_TABLET | ORAL | Status: DC
Start: ? — End: 2019-07-19

## 2019-07-19 MED ORDER — CETIRIZINE HCL 10 MG PO TABS
10.00 | ORAL_TABLET | ORAL | Status: DC
Start: ? — End: 2019-07-19

## 2019-07-19 MED ORDER — NICOTINE POLACRILEX 2 MG MT GUM
2.00 | CHEWING_GUM | OROMUCOSAL | Status: DC
Start: ? — End: 2019-07-19

## 2019-07-19 MED ORDER — ONDANSETRON 8 MG PO TBDP
8.00 | ORAL_TABLET | ORAL | Status: DC
Start: ? — End: 2019-07-19

## 2019-07-19 MED ORDER — ALUM & MAG HYDROXIDE-SIMETH 200-200-20 MG/5ML PO SUSP
30.00 | ORAL | Status: DC
Start: ? — End: 2019-07-19

## 2019-07-19 MED ORDER — OLANZAPINE 10 MG IM SOLR
10.00 | INTRAMUSCULAR | Status: DC
Start: ? — End: 2019-07-19

## 2019-07-19 MED ORDER — BISACODYL 5 MG PO TBEC
10.00 | DELAYED_RELEASE_TABLET | ORAL | Status: DC
Start: ? — End: 2019-07-19

## 2019-07-19 MED ORDER — CLONIDINE HCL 0.1 MG PO TABS
0.10 | ORAL_TABLET | ORAL | Status: DC
Start: ? — End: 2019-07-19

## 2019-07-19 MED ORDER — DIPHENHYDRAMINE HCL 25 MG PO CAPS
50.00 | ORAL_CAPSULE | ORAL | Status: DC
Start: ? — End: 2019-07-19

## 2019-08-24 ENCOUNTER — Ambulatory Visit (HOSPITAL_COMMUNITY): Payer: Federal, State, Local not specified - Other | Admitting: Psychiatry

## 2021-03-12 IMAGING — CR PORTABLE CHEST - 1 VIEW
1 series · 1 of 1 positions shown · non-contrast
Comparison: None.

CLINICAL DATA: Altered mental status.

EXAM:
PORTABLE CHEST 1 VIEW

[x chest ap]
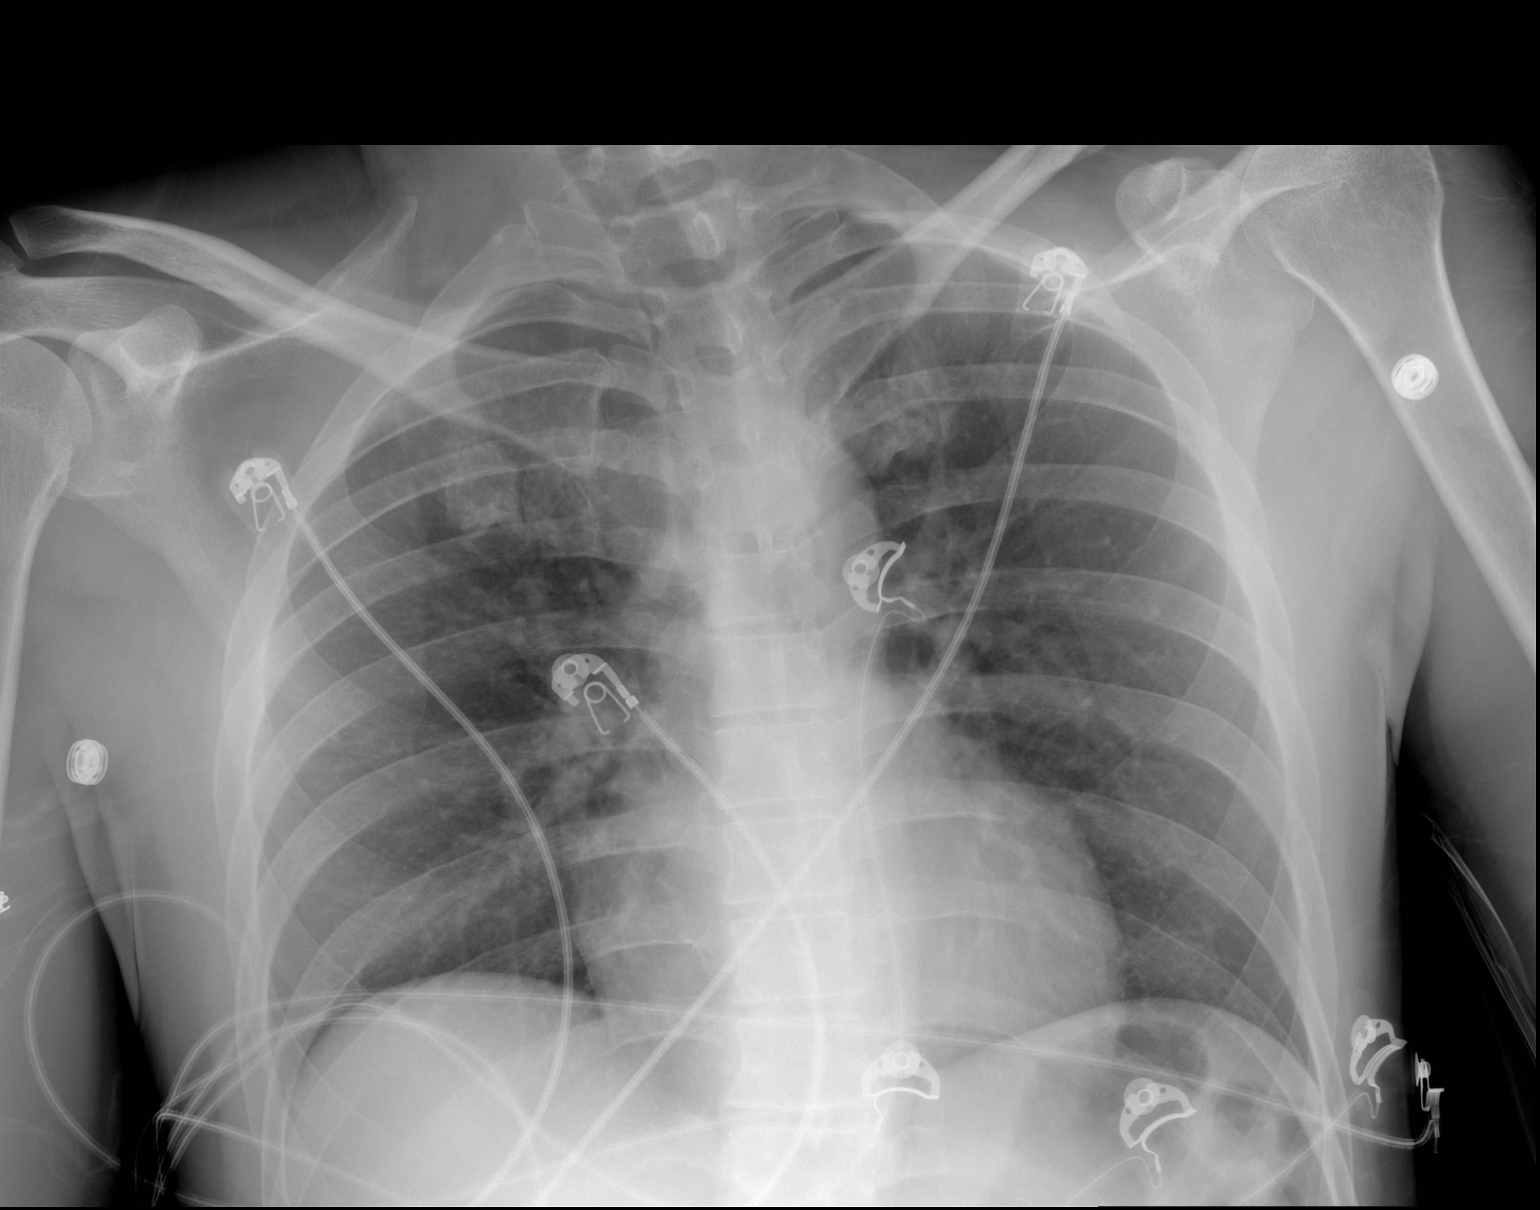

[1 of 1 positions shown; findings below may reference images not displayed]

FINDINGS: The heart size and mediastinal contours are within normal limits.
Both lungs are clear. The visualized skeletal structures are
unremarkable.
IMPRESSION: No active disease.

## 2021-10-08 IMAGING — CT CT RENAL STONE PROTOCOL
2 of 4 series · 17 of 46 positions shown, 19 images · non-contrast
Comparison: None.

CLINICAL DATA: Left flank pain.

EXAM:
CT ABDOMEN AND PELVIS WITHOUT CONTRAST
TECHNIQUE: Multidetector CT imaging of the abdomen and pelvis was performed
following the standard protocol without IV contrast.

[Series 3: stone study 5.0 i30f 2 · axial · 0.89mm/px · z∈[+594,+1049]mm · 14 of 101 slices shown, 16 images]
[im 5/101  soft-tissue]
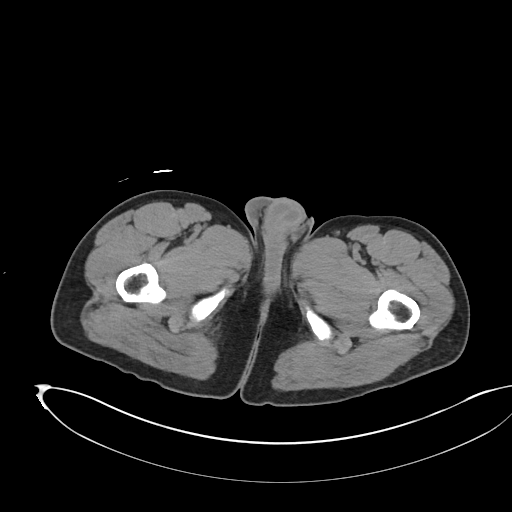
[im 5/101  bone]
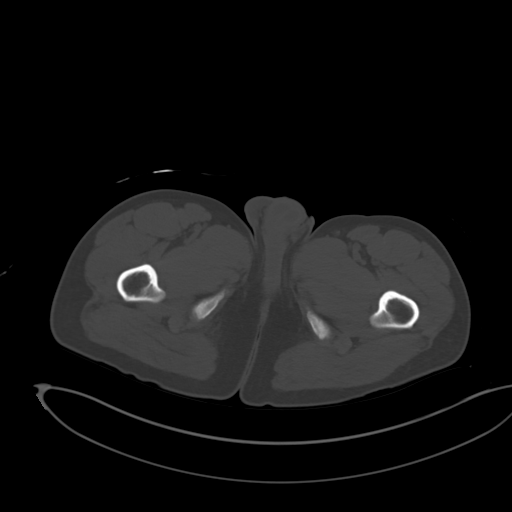
[im 13/101  soft-tissue]
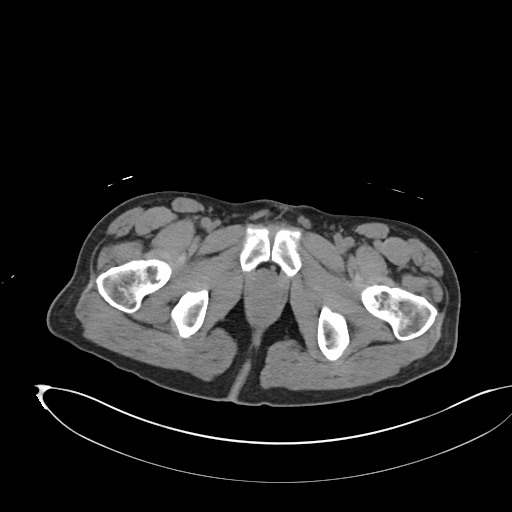
[im 21/101  soft-tissue]
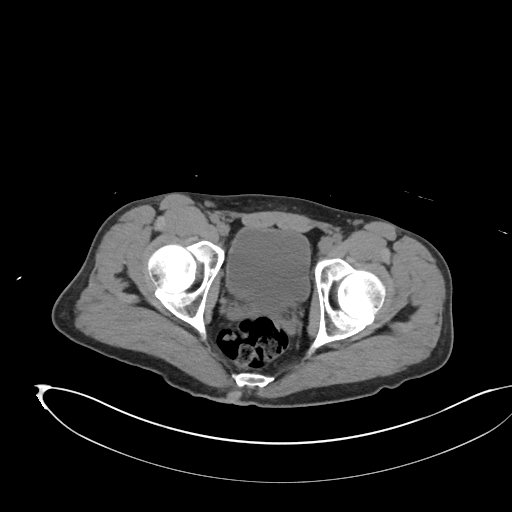
[im 26/101  soft-tissue]
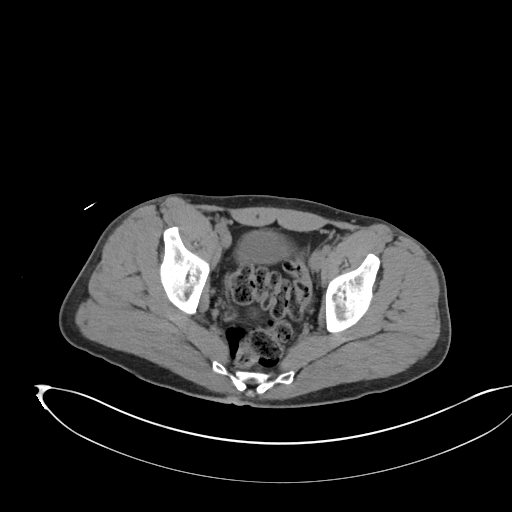
[im 34/101  soft-tissue]
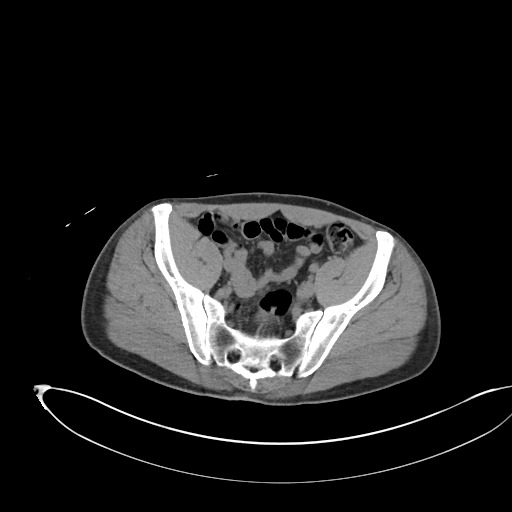
[im 42/101  soft-tissue]
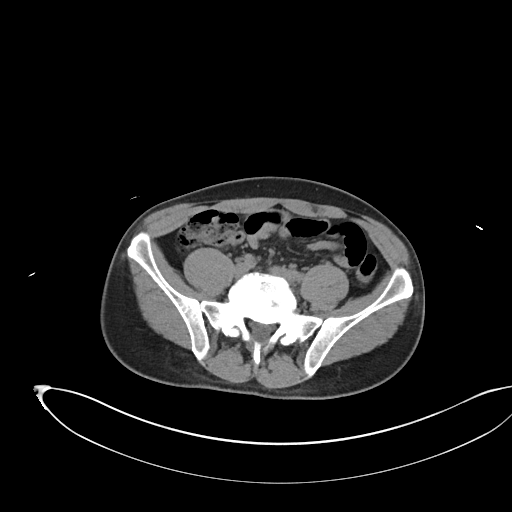
[im 46/101  soft-tissue]
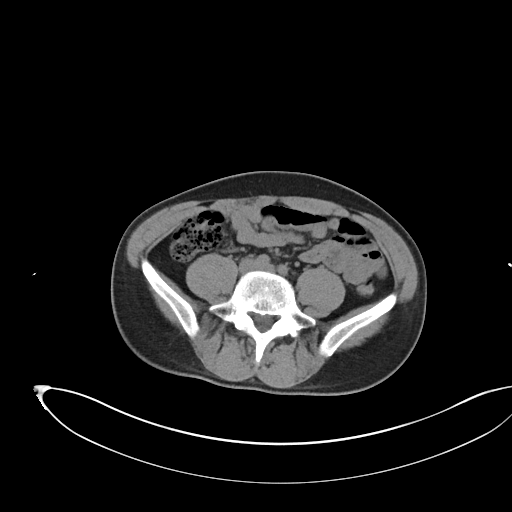
[im 55/101  soft-tissue]
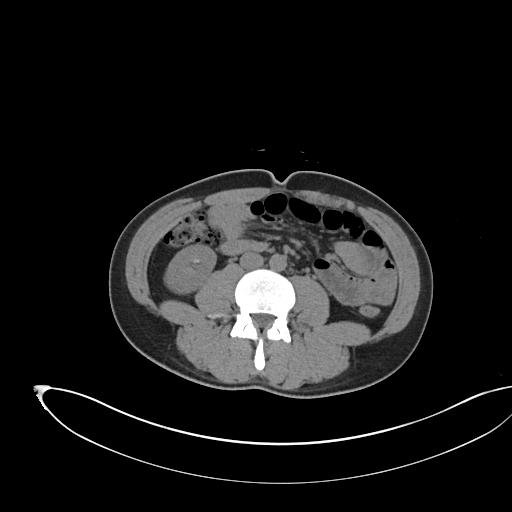
[im 59/101  soft-tissue]
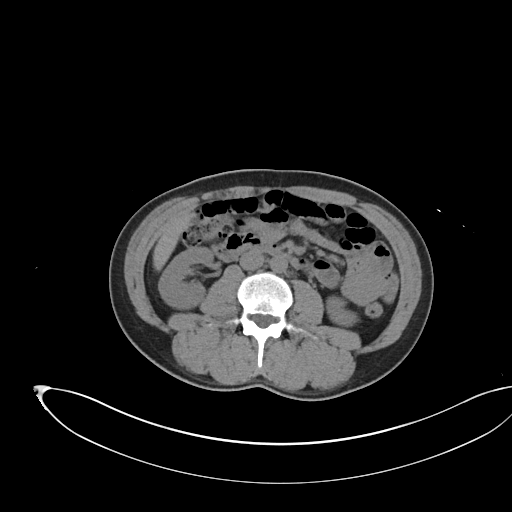
[im 59/101  bone]
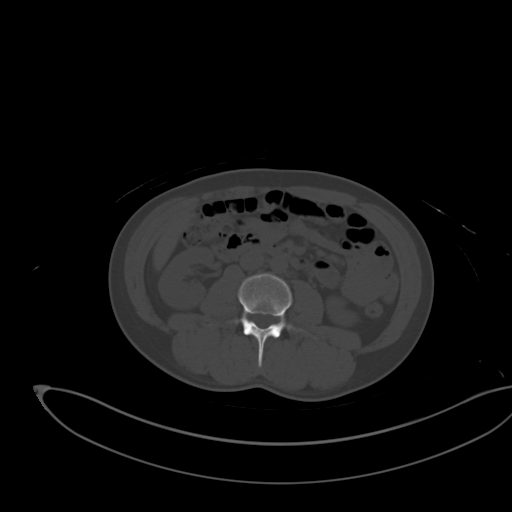
[im 67/101  soft-tissue]
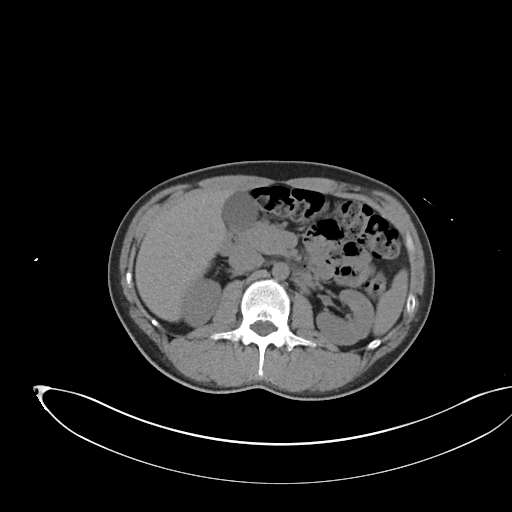
[im 76/101  soft-tissue]
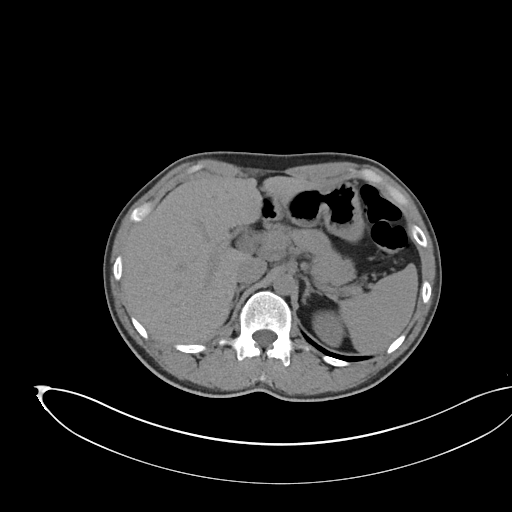
[im 80/101  soft-tissue]
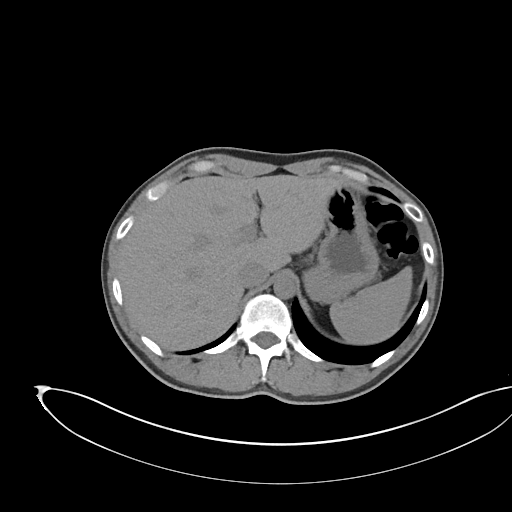
[im 88/101  soft-tissue]
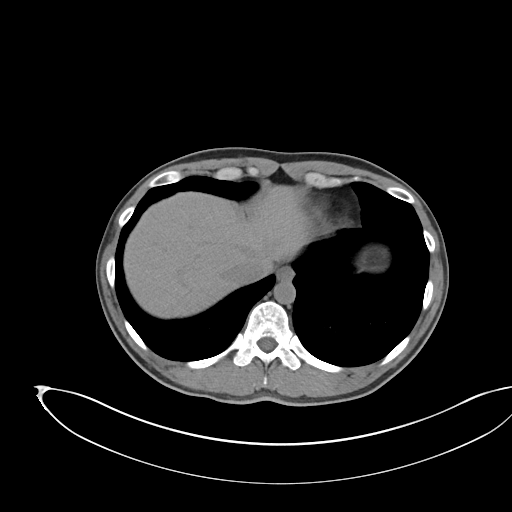
[im 96/101  soft-tissue]
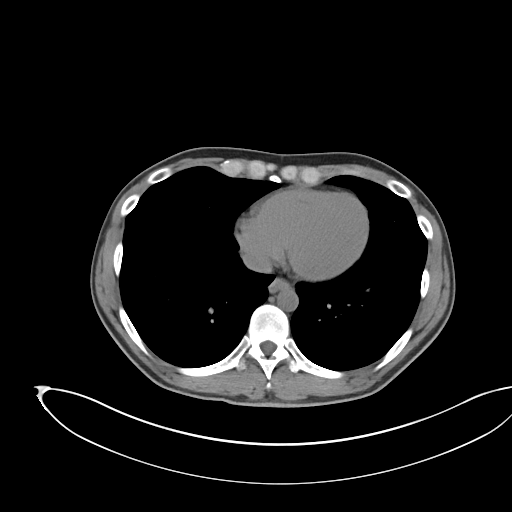

[Series 6: coronal soft tissue · coronal · 0.88mm/px · 3 of 99 slices shown]
[im 33/99  soft-tissue]
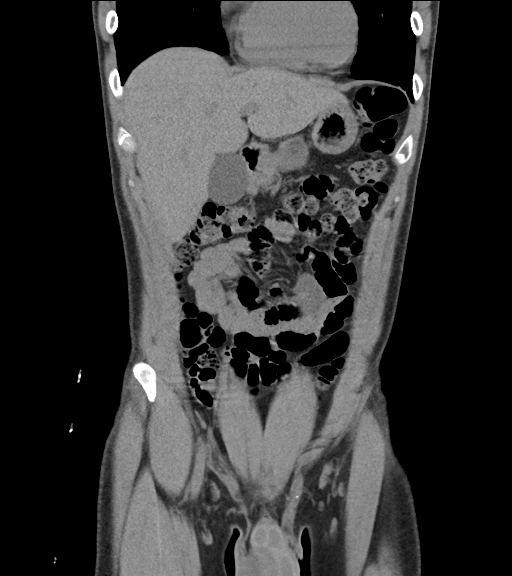
[im 44/99  soft-tissue]
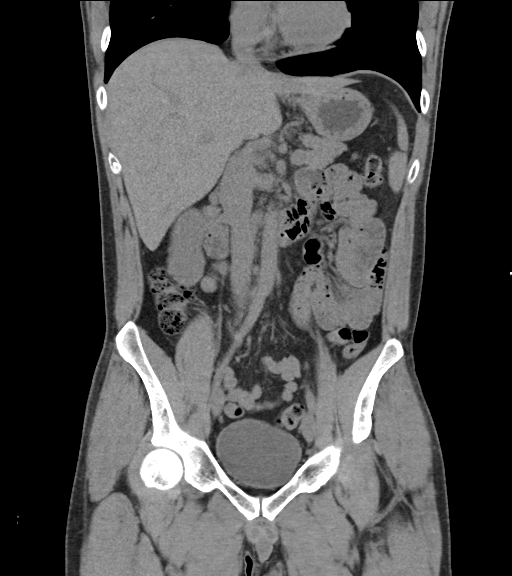
[im 55/99  soft-tissue]
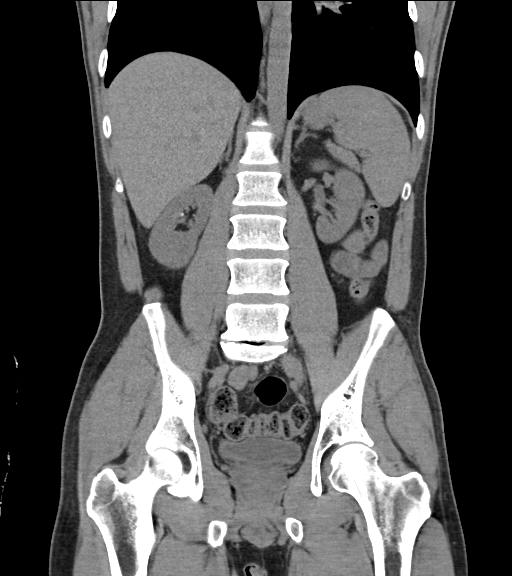

[17 of 46 positions shown; findings below may reference images not displayed]

FINDINGS: Lower chest: No acute abnormality.

Hepatobiliary: No focal liver abnormality is seen. No gallstones,
gallbladder wall thickening, or biliary dilatation.

Pancreas: Unremarkable. No pancreatic ductal dilatation or
surrounding inflammatory changes.

Spleen: Normal in size without focal abnormality.

Adrenals/Urinary Tract: Adrenal glands are unremarkable. Kidneys are
normal in size, without focal lesions. A 2 mm nonobstructing renal
stone is seen within the posterior aspect of the mid left kidney an
additional ill-defined 2 mm nonobstructing renal stone is seen
within the mid to lower left kidney. Multiple 1 mm and 2 mm
nonobstructing renal stones are seen within the mid and lower right
kidney. Bladder is unremarkable.

Stomach/Bowel: Stomach is within normal limits. Appendix appears
normal. No evidence of bowel wall thickening, distention, or
inflammatory changes.

Vascular/Lymphatic: No significant vascular findings are present. No
enlarged abdominal or pelvic lymph nodes.

Reproductive: Prostate is unremarkable.

Other: No abdominal wall hernia or abnormality. No abdominopelvic
ascites.

Musculoskeletal: No acute or significant osseous findings.
IMPRESSION: 1. Multiple tiny bilateral nonobstructing renal stones.
